# Patient Record
Sex: Male | Born: 1935 | Race: White | Hispanic: No | Marital: Married | Smoking: Never smoker
Health system: Southern US, Community
[De-identification: ages and names within clinical notes are randomized; demographics above are authoritative.]

## PROBLEM LIST (undated history)

## (undated) DIAGNOSIS — G9340 Encephalopathy, unspecified: Secondary | ICD-10-CM

## (undated) DIAGNOSIS — I255 Ischemic cardiomyopathy: Secondary | ICD-10-CM

## (undated) DIAGNOSIS — I2510 Atherosclerotic heart disease of native coronary artery without angina pectoris: Secondary | ICD-10-CM

## (undated) DIAGNOSIS — E785 Hyperlipidemia, unspecified: Secondary | ICD-10-CM

## (undated) DIAGNOSIS — R4182 Altered mental status, unspecified: Secondary | ICD-10-CM

## (undated) DIAGNOSIS — H9190 Unspecified hearing loss, unspecified ear: Secondary | ICD-10-CM

## (undated) DIAGNOSIS — N410 Acute prostatitis: Secondary | ICD-10-CM

## (undated) DIAGNOSIS — A523 Neurosyphilis, unspecified: Secondary | ICD-10-CM

## (undated) DIAGNOSIS — M1990 Unspecified osteoarthritis, unspecified site: Secondary | ICD-10-CM

## (undated) DIAGNOSIS — G40909 Epilepsy, unspecified, not intractable, without status epilepticus: Secondary | ICD-10-CM

## (undated) DIAGNOSIS — R413 Other amnesia: Secondary | ICD-10-CM

## (undated) DIAGNOSIS — R269 Unspecified abnormalities of gait and mobility: Secondary | ICD-10-CM

## (undated) HISTORY — DX: Unspecified osteoarthritis, unspecified site: M19.90

## (undated) HISTORY — DX: Epilepsy, unspecified, not intractable, without status epilepticus: G40.909

## (undated) HISTORY — DX: Hyperlipidemia, unspecified: E78.5

## (undated) HISTORY — DX: Ischemic cardiomyopathy: I25.5

## (undated) HISTORY — DX: Unspecified abnormalities of gait and mobility: R26.9

## (undated) HISTORY — DX: Encephalopathy, unspecified: G93.40

## (undated) HISTORY — DX: Acute prostatitis: N41.0

## (undated) HISTORY — DX: Other amnesia: R41.3

## (undated) HISTORY — DX: Atherosclerotic heart disease of native coronary artery without angina pectoris: I25.10

## (undated) HISTORY — DX: Unspecified hearing loss, unspecified ear: H91.90

---

## 2011-02-18 ENCOUNTER — Other Ambulatory Visit (HOSPITAL_COMMUNITY): Payer: Self-pay

## 2012-04-30 ENCOUNTER — Telehealth: Payer: Self-pay | Admitting: Pharmacist

## 2012-04-30 NOTE — Telephone Encounter (Signed)
Unable to leave message. Caller does not accept incoming calls.

## 2015-07-19 ENCOUNTER — Inpatient Hospital Stay (HOSPITAL_COMMUNITY)
Admission: EM | Admit: 2015-07-19 | Discharge: 2015-07-23 | DRG: 246 | Disposition: A | Discharge status: Home / Self Care | Payer: Medicare Other | Source: Other Acute Inpatient Hospital | Attending: Cardiovascular Disease | Admitting: Cardiovascular Disease

## 2015-07-19 ENCOUNTER — Encounter (HOSPITAL_COMMUNITY)
Admission: EM | Disposition: A | Discharge status: Home / Self Care | Payer: Self-pay | Source: Other Acute Inpatient Hospital | Attending: Cardiovascular Disease

## 2015-07-19 ENCOUNTER — Encounter (HOSPITAL_COMMUNITY): Payer: Self-pay | Admitting: Physician Assistant

## 2015-07-19 DIAGNOSIS — F99 Mental disorder, not otherwise specified: Secondary | ICD-10-CM | POA: Diagnosis not present

## 2015-07-19 DIAGNOSIS — I2584 Coronary atherosclerosis due to calcified coronary lesion: Secondary | ICD-10-CM | POA: Diagnosis present

## 2015-07-19 DIAGNOSIS — I5021 Acute systolic (congestive) heart failure: Secondary | ICD-10-CM | POA: Diagnosis present

## 2015-07-19 DIAGNOSIS — M79601 Pain in right arm: Secondary | ICD-10-CM | POA: Diagnosis present

## 2015-07-19 DIAGNOSIS — I2510 Atherosclerotic heart disease of native coronary artery without angina pectoris: Secondary | ICD-10-CM | POA: Diagnosis present

## 2015-07-19 DIAGNOSIS — I255 Ischemic cardiomyopathy: Secondary | ICD-10-CM | POA: Diagnosis present

## 2015-07-19 DIAGNOSIS — I213 ST elevation (STEMI) myocardial infarction of unspecified site: Secondary | ICD-10-CM | POA: Diagnosis present

## 2015-07-19 DIAGNOSIS — R079 Chest pain, unspecified: Secondary | ICD-10-CM | POA: Diagnosis present

## 2015-07-19 DIAGNOSIS — I2102 ST elevation (STEMI) myocardial infarction involving left anterior descending coronary artery: Secondary | ICD-10-CM | POA: Diagnosis present

## 2015-07-19 HISTORY — PX: CARDIAC CATHETERIZATION: SHX172

## 2015-07-19 LAB — TROPONIN I: TROPONIN I: 38.25 ng/mL — AB (ref <0.031)

## 2015-07-19 LAB — POCT ACTIVATED CLOTTING TIME: Activated Clotting Time: 503 seconds

## 2015-07-19 LAB — TSH: TSH: 1.032 u[IU]/mL (ref 0.350–4.500)

## 2015-07-19 LAB — MRSA PCR SCREENING: MRSA by PCR: NEGATIVE

## 2015-07-19 LAB — BRAIN NATRIURETIC PEPTIDE: B Natriuretic Peptide: 189.4 pg/mL — ABNORMAL HIGH (ref 0.0–100.0)

## 2015-07-19 SURGERY — LEFT HEART CATH AND CORONARY ANGIOGRAPHY

## 2015-07-19 MED ORDER — TICAGRELOR 90 MG PO TABS
ORAL_TABLET | ORAL | Status: DC | PRN
  Administered 2015-07-19: 180 mg via ORAL

## 2015-07-19 MED ORDER — SODIUM CHLORIDE 0.9 % IV SOLN
INTRAVENOUS | Status: AC
  Administered 2015-07-19: 16:00:00 via INTRAVENOUS

## 2015-07-19 MED ORDER — SODIUM CHLORIDE 0.9 % IJ SOLN
3.0000 mL | Freq: Two times a day (BID) | INTRAMUSCULAR | Status: DC
  Administered 2015-07-19 – 2015-07-23 (×7): 3 mL via INTRAVENOUS

## 2015-07-19 MED ORDER — ATORVASTATIN CALCIUM 80 MG PO TABS
80.0000 mg | ORAL_TABLET | Freq: Every day | ORAL | Status: DC
  Administered 2015-07-19 – 2015-07-22 (×4): 80 mg via ORAL
  Filled 2015-07-19 (×5): qty 1

## 2015-07-19 MED ORDER — CARVEDILOL 3.125 MG PO TABS
3.1250 mg | ORAL_TABLET | Freq: Two times a day (BID) | ORAL | Status: DC
  Administered 2015-07-19 – 2015-07-23 (×8): 3.125 mg via ORAL
  Filled 2015-07-19 (×10): qty 1

## 2015-07-19 MED ORDER — ONDANSETRON HCL 4 MG/2ML IJ SOLN: 4.0000 mg | Freq: Four times a day (QID) | INTRAMUSCULAR | Status: DC | PRN

## 2015-07-19 MED ORDER — ASPIRIN EC 81 MG PO TBEC
81.0000 mg | DELAYED_RELEASE_TABLET | Freq: Every day | ORAL | Status: DC
  Administered 2015-07-20 – 2015-07-23 (×4): 81 mg via ORAL
  Filled 2015-07-19 (×4): qty 1

## 2015-07-19 MED ORDER — TICAGRELOR 90 MG PO TABS
ORAL_TABLET | ORAL | Status: AC
  Filled 2015-07-19: qty 2

## 2015-07-19 MED ORDER — BIVALIRUDIN BOLUS VIA INFUSION - CUPID
INTRAVENOUS | Status: DC | PRN
  Administered 2015-07-19 (×2): 44.25 mg via INTRAVENOUS

## 2015-07-19 MED ORDER — BIVALIRUDIN 250 MG IV SOLR
INTRAVENOUS | Status: AC
  Filled 2015-07-19: qty 250

## 2015-07-19 MED ORDER — VERAPAMIL HCL 2.5 MG/ML IV SOLN
INTRAVENOUS | Status: DC | PRN
  Administered 2015-07-19: 14:00:00 via INTRA_ARTERIAL

## 2015-07-19 MED ORDER — ACETAMINOPHEN 325 MG PO TABS: 650.0000 mg | ORAL_TABLET | ORAL | Status: DC | PRN

## 2015-07-19 MED ORDER — MIDAZOLAM HCL 2 MG/2ML IJ SOLN
INTRAMUSCULAR | Status: AC
  Filled 2015-07-19: qty 4

## 2015-07-19 MED ORDER — FENTANYL CITRATE (PF) 100 MCG/2ML IJ SOLN
INTRAMUSCULAR | Status: DC | PRN
  Administered 2015-07-19: 25 ug via INTRAVENOUS

## 2015-07-19 MED ORDER — NITROGLYCERIN 1 MG/10 ML FOR IR/CATH LAB
INTRA_ARTERIAL | Status: AC
  Filled 2015-07-19: qty 10

## 2015-07-19 MED ORDER — HEPARIN (PORCINE) IN NACL 2-0.9 UNIT/ML-% IJ SOLN
INTRAMUSCULAR | Status: AC
  Filled 2015-07-19: qty 1500

## 2015-07-19 MED ORDER — HEPARIN SODIUM (PORCINE) 1000 UNIT/ML IJ SOLN
INTRAMUSCULAR | Status: AC
  Filled 2015-07-19: qty 1

## 2015-07-19 MED ORDER — FENTANYL CITRATE (PF) 100 MCG/2ML IJ SOLN
INTRAMUSCULAR | Status: AC
  Filled 2015-07-19: qty 4

## 2015-07-19 MED ORDER — SODIUM CHLORIDE 0.9 % IJ SOLN: 3.0000 mL | INTRAMUSCULAR | Status: DC | PRN

## 2015-07-19 MED ORDER — IOHEXOL 300 MG/ML  SOLN
INTRAMUSCULAR | Status: DC | PRN
  Administered 2015-07-19: 125 mL via INTRAVENOUS

## 2015-07-19 MED ORDER — NITROGLYCERIN 0.4 MG SL SUBL: 0.4000 mg | SUBLINGUAL_TABLET | SUBLINGUAL | Status: DC | PRN

## 2015-07-19 MED ORDER — SODIUM CHLORIDE 0.9 % IV SOLN: 250.0000 mL | INTRAVENOUS | Status: DC | PRN

## 2015-07-19 MED ORDER — TICAGRELOR 90 MG PO TABS
90.0000 mg | ORAL_TABLET | Freq: Two times a day (BID) | ORAL | Status: DC
  Administered 2015-07-19 – 2015-07-23 (×8): 90 mg via ORAL
  Filled 2015-07-19 (×10): qty 1

## 2015-07-19 MED ORDER — LIDOCAINE HCL (PF) 1 % IJ SOLN
INTRAMUSCULAR | Status: AC
  Filled 2015-07-19: qty 30

## 2015-07-19 MED ORDER — ASPIRIN 81 MG PO CHEW
324.0000 mg | CHEWABLE_TABLET | ORAL | Status: AC
  Administered 2015-07-19: 324 mg via ORAL
  Filled 2015-07-19: qty 4

## 2015-07-19 MED ORDER — ASPIRIN 300 MG RE SUPP: 300.0000 mg | RECTAL | Status: AC

## 2015-07-19 MED ORDER — MIDAZOLAM HCL 2 MG/2ML IJ SOLN
INTRAMUSCULAR | Status: DC | PRN
  Administered 2015-07-19: 1 mg via INTRAVENOUS

## 2015-07-19 SURGICAL SUPPLY — 19 items
BALLN EUPHORA RX 2.5X15 (BALLOONS) ×3
BALLN ~~LOC~~ EMERGE MR 3.5X20 (BALLOONS) ×3
BALLOON EUPHORA RX 2.5X15 (BALLOONS) ×1 IMPLANT
BALLOON ~~LOC~~ EMERGE MR 3.5X20 (BALLOONS) ×1 IMPLANT
CATH INFINITI 5 FR JL3.5 (CATHETERS) IMPLANT
CATH INFINITI 5FR ANG PIGTAIL (CATHETERS) ×3 IMPLANT
CATH INFINITI JR4 5F (CATHETERS) ×3 IMPLANT
CATH VISTA GUIDE 6FR XBLAD3.5 (CATHETERS) ×3 IMPLANT
DEVICE RAD COMP TR BAND LRG (VASCULAR PRODUCTS) ×3 IMPLANT
GLIDESHEATH SLEND SS 6F .021 (SHEATH) ×3 IMPLANT
KIT ENCORE 26 ADVANTAGE (KITS) ×3 IMPLANT
KIT HEART LEFT (KITS) ×3 IMPLANT
PACK CARDIAC CATHETERIZATION (CUSTOM PROCEDURE TRAY) ×3 IMPLANT
STENT PROMUS PREM MR 3.0X28 (Permanent Stent) ×3 IMPLANT
SYR MEDRAD MARK V 150ML (SYRINGE) ×3 IMPLANT
TRANSDUCER W/STOPCOCK (MISCELLANEOUS) ×3 IMPLANT
TUBING CIL FLEX 10 FLL-RA (TUBING) ×3 IMPLANT
WIRE COUGAR XT STRL 190CM (WIRE) ×3 IMPLANT
WIRE SAFE-T 1.5MM-J .035X260CM (WIRE) ×3 IMPLANT

## 2015-07-19 NOTE — Progress Notes (Signed)
CRITICAL VALUE ALERT  Critical value received:  Troponin 38.25  Date of notification:  07/19/15  Time of notification:  1740  Critical value read back:Yes.    Nurse who received alert:  Deretha Emory, RN  Pt is s/p STEMI, expected finding. Will continue to cycle troponin levels and monitor.

## 2015-07-19 NOTE — Progress Notes (Signed)
Ur review done 

## 2015-07-19 NOTE — H&P (Signed)
Patient ID: Justin Figueroa MRN: [id number], DOB/AGE: 07/08/1931   Admit date: 07/19/2015   Primary Physician: No primary care provider on file. Primary Cardiologist: New ( Dr. Excell Seltzer)    Pt. Profile:  Justin Figueroa is a 81 y.o. male with a history of right arm pain and no other past medical history who presented to [facility] today from [facility] as a CODE STEMI.   The patient denies a history of DM, HTN, HLD, stroke, bleeding or prior heart disease who presented to [facility] today with chest pain that started this morning after breakfast. ST elevations were noted on ECG and he was told he needed to be transferred to [facility] for cardiac cath. He left AMA but then returned with continued chest pain.   The chest pain started this morning and is described as indigestion like. It was not associated with SOB, diaphoresis, nausea or vomiting. No orthopnea, PND or LE edema. He has never smoked, does not drink or take drugs. He only takes one medication occasionally for arm pain, unclear what this is. He does not have a family history of cardiac disease that he knows of. Patient is difficult to understand and a poor historian.   Patient is now chest pain free after ASA and SL NTG. He is seen in the cardiac cath lab for cardiac catheterization. ECG with ST elevation in anterior leads.   Problem List  Past Medical History  Diagnosis Date  . Right arm pain     No past surgical history on file.   Allergies  Allergies not on file   Home Medications  Prior to Admission medications   Not on File    Family History  No family history on file. No family status information on file.     Social History  Social History   Social History  . Marital Status: Married    Spouse Name: N/A  . Number of Children: N/A  . Years of Education: N/A   Occupational History  . Not on file.   Social History Main Topics  . Smoking status: Not on file  . Smokeless tobacco: Not on file  .  Alcohol Use: Not on file  . Drug Use: Not on file  . Sexual Activity: Not on file   Other Topics Concern  . Not on file   Social History Narrative  . No narrative on file     All other systems reviewed and are otherwise negative except as noted above.  Physical Exam  SpO2 99 %.  General: Pleasant, NAD. Elderly and frail appearing. Poor dentition Psych: Normal affect. Neuro: Alert and oriented X 3. Moves all extremities spontaneously. HEENT: Normal  Neck: Supple without bruits or JVD. Lungs:  Resp regular and unlabored, CTA. Heart: RRR no s3, s4, or murmurs. Abdomen: Soft, non-tender, non-distended, BS + x 4.  Extremities: No clubbing, cyanosis or edema. DP/PT/Radials 2+ and equal bilaterally.  Labs  No results for input(s): CKTOTAL, CKMB, TROPONINI in the last 72 hours. No results found for: WBC, HGB, HCT, MCV, PLT No results for input(s): NA, K, CL, CO2, BUN, CREATININE, CALCIUM, PROT, BILITOT, ALKPHOS, ALT, AST, GLUCOSE in the last 168 hours.  Invalid input(s): LABALBU No results found for: CHOL, HDL, LDLCALC, TRIG No results found for: DDIMER   Radiology/Studies  No results found.  ECG NSR ST elevation in anterior leads.   ASSESSMENT AND PLAN  Justin Figueroa is a 81 y.o. male with a history of right arm  pain and no other past medical history who presented to [facility] today from [facility] as a CODE STEMI.   PLAN: He will undergo emergent cardiac catheterization and possible PCI.    Billy Fischer, PA-C 07/19/2015, 2:26 PM  Pager [phone]  Patient seen, examined. Available data reviewed. Agree with findings, assessment, and plan as outlined by Carlean Jews, PA-C. The patient is a difficult historian. He was evaluated this morning and diagnosed with an anterior STEMI. He left the hospital AGAINST MEDICAL ADVICE. He returned with continued chest pain approximately one hour later. A code STEMI was called and he is transferred emergently to the cardiac  catheterization lab for primary PCI. On arrival here his chest pain has improved. He states that it started at 8 AM today. He has no other complaints. He denies any significant medical problems. Exam as outlined above. EKG shows anterolateral injury current consistent with an acute anterior myocardial infarction. There are frequent PVCs. Emergency implied consent is obtained for cardiac catheterization and primary PCI. Further plans and disposition pending his cardiac catheterization results. Guideline directed medical therapy will be initiated.  Tonny Bollman, M.D. 07/19/2015 3:24 PM

## 2015-07-20 ENCOUNTER — Encounter (HOSPITAL_COMMUNITY): Payer: Self-pay | Admitting: Cardiovascular Disease

## 2015-07-20 ENCOUNTER — Inpatient Hospital Stay (HOSPITAL_COMMUNITY): Payer: Medicare Other

## 2015-07-20 DIAGNOSIS — R079 Chest pain, unspecified: Secondary | ICD-10-CM

## 2015-07-20 DIAGNOSIS — F99 Mental disorder, not otherwise specified: Secondary | ICD-10-CM

## 2015-07-20 LAB — CBC
HEMATOCRIT: 41.1 % (ref 39.0–52.0)
HEMOGLOBIN: 13.6 g/dL (ref 13.0–17.0)
MCH: 29.4 pg (ref 26.0–34.0)
MCHC: 33.1 g/dL (ref 30.0–36.0)
MCV: 88.8 fL (ref 78.0–100.0)
Platelets: 195 10*3/uL (ref 150–400)
RBC: 4.63 MIL/uL (ref 4.22–5.81)
RDW: 14.8 % (ref 11.5–15.5)
WBC: 10.1 10*3/uL (ref 4.0–10.5)

## 2015-07-20 LAB — BRAIN NATRIURETIC PEPTIDE: B Natriuretic Peptide: 682.1 pg/mL — ABNORMAL HIGH (ref 0.0–100.0)

## 2015-07-20 LAB — TROPONIN I: Troponin I: >65 ng/mL (ref <0.031)

## 2015-07-20 LAB — LIPID PANEL
CHOLESTEROL: 153 mg/dL (ref 0–200)
HDL: 25 mg/dL — AB (ref >40)
LDL Cholesterol: 104 mg/dL — ABNORMAL HIGH (ref 0–99)
TRIGLYCERIDES: 118 mg/dL (ref <150)
Total CHOL/HDL Ratio: 6.1 RATIO
VLDL: 24 mg/dL (ref 0–40)

## 2015-07-20 LAB — BASIC METABOLIC PANEL
ANION GAP: 6 (ref 5–15)
BUN: 9 mg/dL (ref 6–20)
CO2: 26 mmol/L (ref 22–32)
Calcium: 8.2 mg/dL — ABNORMAL LOW (ref 8.9–10.3)
Chloride: 108 mmol/L (ref 101–111)
Creatinine, Ser: 0.76 mg/dL (ref 0.61–1.24)
GFR calc Af Amer: >60 mL/min (ref >60)
GFR calc non Af Amer: >60 mL/min (ref >60)
GLUCOSE: 107 mg/dL — AB (ref 65–99)
POTASSIUM: 3.6 mmol/L (ref 3.5–5.1)
Sodium: 140 mmol/L (ref 135–145)

## 2015-07-20 LAB — HEMOGLOBIN A1C
Hgb A1c MFr Bld: 6.2 % — ABNORMAL HIGH (ref 4.8–5.6)
MEAN PLASMA GLUCOSE: 131 mg/dL

## 2015-07-20 MED ORDER — POTASSIUM CHLORIDE CRYS ER 20 MEQ PO TBCR
40.0000 meq | EXTENDED_RELEASE_TABLET | Freq: Once | ORAL | Status: AC
  Administered 2015-07-20: 40 meq via ORAL
  Filled 2015-07-20: qty 2

## 2015-07-20 MED FILL — Bivalirudin For IV Soln 250 MG: INTRAVENOUS | Qty: 250 | Status: AC

## 2015-07-20 MED FILL — Nitroglycerin IV Soln 100 MCG/ML in D5W: INTRA_ARTERIAL | Qty: 10 | Status: AC

## 2015-07-20 MED FILL — Sodium Chloride IV Soln 0.9%: INTRAVENOUS | Qty: 50 | Status: AC

## 2015-07-20 MED FILL — Heparin Sodium (Porcine) 2 Unit/ML in Sodium Chloride 0.9%: INTRAMUSCULAR | Qty: 1000 | Status: AC

## 2015-07-20 MED FILL — Lidocaine HCl Local Preservative Free (PF) Inj 1%: INTRAMUSCULAR | Qty: 60 | Status: AC

## 2015-07-20 MED FILL — Heparin Sodium (Porcine) 2 Unit/ML in Sodium Chloride 0.9%: INTRAMUSCULAR | Qty: 1500 | Status: AC

## 2015-07-20 NOTE — Progress Notes (Signed)
  Echocardiogram 2D Echocardiogram has been performed.  Cathie Beams 07/20/2015, 2:44 PM

## 2015-07-20 NOTE — Progress Notes (Addendum)
       Patient Name: Justin Figueroa Date of Encounter: 07/20/2015    SUBJECTIVE: The patient has significant social issues. He also has poor understanding of his illness. He feels great and wants to go home now. Hemolysis transfer him back to the hospital in the [place] so that a frank and put him up to carry him home. EF by acute angioma 35%.  TELEMETRY:  Intermittent PVCs. Normal sinus rhythm. Filed Vitals:   07/20/15 0500 07/20/15 0600 07/20/15 0725 07/20/15 0738  BP: 108/67 84/37 99/50    Pulse:    66  Temp:    97.6 F (36.4 C)  TempSrc:    Oral  Resp: Height:      Weight:      SpO2:   94%     Intake/Output Summary (Last 24 hours) at 07/20/15 0951 Last data filed at 07/20/15 0900  Gross per 24 hour  Intake 806.67 ml  Output   1209 ml  Net -402.33 ml   LABS: Basic Metabolic Panel:  Recent Labs  16/10/96 0309  NA 140  K 3.6  CL 108  CO2 26  GLUCOSE 107*  BUN 9  CREATININE 0.76  CALCIUM 8.2*   CBC:  Recent Labs  07/20/15 0309  WBC 10.1  HGB 13.6  HCT 41.1  MCV 88.8  PLT 195   Cardiac Enzymes:  Recent Labs  07/19/15 1630 07/19/15 2118 07/20/15 0309  TROPONINI 38.25* >65.00* >65.00*   BNP: Invalid input(s): POCBNP Hemoglobin A1C:  Recent Labs  07/19/15 1630  HGBA1C 6.2*   Fasting Lipid Panel:  Recent Labs  07/20/15 0309  CHOL 153  HDL 25*  LDLCALC 104*  TRIG 118  CHOLHDL 6.1    Radiology/Studies:  No new data  Physical Exam: Blood pressure 99/50, pulse 66, temperature 97.6 F (36.4 C), temperature source Oral, resp. rate 20, height 6' (1.829 m), weight 64 kg (141 lb 1.5 oz), SpO2 94 %. Weight change:   Wt Readings from Last 3 Encounters:  07/20/15 64 kg (141 lb 1.5 oz)   Flat neck veins. Lying flat in bed. S4 gallop Clear lungs  ASSESSMENT:  1. Large anterior myocardial infarction with delayed reperfusion. LAD treated with DES 2. Soft blood pressures preventing titration of medical therapy 3. Acute  systolic heart failure, without evidence of volume overload currently 4. Significant social issues including poor understanding and psychiatric illness.. At risk for medication compliance issues.  Plan:   Case management/social service help  Risk factor modification  Dual antiplatelets therapy for at least 1 year  Titrate heart failure therapy as tolerated by blood pressure.  If develops volume overload, implement spironolactone rather than a loop, until he is able to tolerate an Ace or ARB.  Under the current social/living/psychiatric constraints, should probably be in hospital until Monday.  Selinda Eon 07/20/2015, 9:51 AM

## 2015-07-20 NOTE — Progress Notes (Signed)
CARDIAC REHAB PHASE I   PRE:  Rate/Rhythm: 67 Bigeminy/trigeminy  BP:  Sitting: 92/41        SaO2: 100 RA  MODE:  Ambulation: 350 ft   POST:  Rate/Rhythm: 59 Bigeminy  BP:  Sitting: 83/44         SaO2: 97 RA  Pt ambulated 350 ft on RA, rolling walker, gait belt, handheld assist, steady gait, tolerated well. Pt denies cp, dizziness, DOE, declined rest stop. Pt BP low, denies symptoms. Pt did have 6 beats Vtach while sitting up in chair during education per RN, pt had no complaints. Began MI/stent education. Reviewed risk factors, anti-platelet therapy, stent card, activity restrictions. Gave pt MI book and reviewed pictures. Pt does not read and is difficult to assess compreshension. Pt verbalized understanding of education, did perform some teach back, however would benefit from further teach back/ review. Difficult to understand pt's speech at times and difficult to understand pt's psycho/social situation/needs. Pt states he lives alone and has a "case worker, Bethann Berkshire" who helps him with his medication and provides transportation for appointments. Pt will benefit from SW consult, possibly HHRN. Pt states he is agreeable to stay in hospital until the doctor releases him, will benefit from extra time to complete education. Pt declined MI video, RN states she will try to play it for him later. Pt to recliner after walk, call bell within reach.   [id number]   Joylene Grapes, RN, BSN 07/20/2015 12:02 PM

## 2015-07-20 NOTE — Progress Notes (Signed)
Pt had 6 beat run VT. VSS. Pt asymptomatic.  K 3.6 this AM.  Dr. Katrinka Blazing notified. Orders received.  Will continue to monitor.

## 2015-07-20 NOTE — Care Management Note (Signed)
Case Management Note  Patient Details  Name: Justin Figueroa MRN: [id number] Date of Birth: May 19, 1931  Subjective/Objective:         Adm w mi           Action/Plan:lives alone. He states medicaid case worker, his neighbors, mobile meals ck on him. He has 2 children also.    Expected Discharge Date:                  Expected Discharge Plan:  Home/Self Care  In-House Referral:     Discharge planning Services  CM Consult, Medication Assistance  Post Acute Care Choice:    Choice offered to:     DME Arranged:    DME Agency:     HH Arranged:    HH Agency:     Status of Service:     Medicare Important Message Given:    Date Medicare IM Given:    Medicare IM give by:    Date Additional Medicare IM Given:    Additional Medicare Important Message give by:     If discussed at Long Length of Stay Meetings, dates discussed:    Additional Comments: gave pt brilinta 30day free card. Pt states has medicare and medicaid for meds.  Hanley Hays, RN 07/20/2015, 9:12 AM

## 2015-07-20 NOTE — Progress Notes (Signed)
EKG CRITICAL VALUE     12 lead EKG performed.  Critical value noted.  Dayna Barker, RN notified.   Shakaria Raphael, CCT 07/20/2015 7:43 AM

## 2015-07-21 DIAGNOSIS — I255 Ischemic cardiomyopathy: Secondary | ICD-10-CM

## 2015-07-21 LAB — BASIC METABOLIC PANEL
Anion gap: 9 (ref 5–15)
BUN: 9 mg/dL (ref 6–20)
CALCIUM: 8.2 mg/dL — AB (ref 8.9–10.3)
CHLORIDE: 104 mmol/L (ref 101–111)
CO2: 25 mmol/L (ref 22–32)
CREATININE: 0.85 mg/dL (ref 0.61–1.24)
GFR calc non Af Amer: >60 mL/min (ref >60)
Glucose, Bld: 105 mg/dL — ABNORMAL HIGH (ref 65–99)
Potassium: 4.4 mmol/L (ref 3.5–5.1)
SODIUM: 138 mmol/L (ref 135–145)

## 2015-07-21 MED ORDER — SPIRONOLACTONE 25 MG PO TABS
12.5000 mg | ORAL_TABLET | Freq: Every day | ORAL | Status: DC
  Administered 2015-07-21 – 2015-07-23 (×3): 12.5 mg via ORAL
  Filled 2015-07-21 (×3): qty 1

## 2015-07-21 NOTE — Progress Notes (Signed)
CARDIAC REHAB PHASE I   PRE:  Rate/Rhythm: 80 NSR with frequent pvcs  BP:  Sitting: 94/68      SaO2: 98 RA  MODE:  Ambulation: 350 ft   POST:  Rate/Rhythm: 88 SR with pvcs  BP:  Sitting: 113/68     SaO2: 99 RA 1325-1345 Patient ambulated in hallway x 1 assist with RW. Slow steady gait noted. Patient denied complaints. Post ambulation patient to chair with call bell, phone and bedside table in reach. Possible D/C Monday. Will continue to follow and reinforce education prior to D/C.  Maude Leriche, BSN 07/21/2015 1:48 PM

## 2015-07-21 NOTE — Progress Notes (Signed)
EKG CRITICAL VALUE     12 lead EKG performed.  Critical value noted.  Ann Maki, RN notified.   Ihor Gully, CCT 07/21/2015 6:59 AM

## 2015-07-21 NOTE — Progress Notes (Signed)
Patient ID: Justin Figueroa, male   DOB: 06/02/1931, 81 y.o.   MRN: [id number]   SUBJECTIVE: Pressured speech.  No chest pain or dyspnea.  BP remains soft.  No lightheadedness.   Scheduled Meds: . aspirin EC  81 mg Oral Daily  . atorvastatin  80 mg Oral q1800  . carvedilol  3.125 mg Oral BID WC  . sodium chloride  3 mL Intravenous Q12H  . spironolactone  12.5 mg Oral Daily  . ticagrelor  90 mg Oral BID   Continuous Infusions:  PRN Meds:.sodium chloride, acetaminophen, nitroGLYCERIN, ondansetron (ZOFRAN) IV, sodium chloride    Filed Vitals:   07/21/15 0500 07/21/15 0600 07/21/15 0700 07/21/15 0730  BP: 101/35 71/46 103/54 103/54  Pulse:      Temp:    99.2 F (37.3 C)  TempSrc:    Oral  Resp: Height:      Weight:      SpO2:    97%    Intake/Output Summary (Last 24 hours) at 07/21/15 0850 Last data filed at 07/21/15 0800  Gross per 24 hour  Intake    690 ml  Output    700 ml  Net    -10 ml    LABS: Basic Metabolic Panel:  Recent Labs  40/98/11 0309  NA 140  K 3.6  CL 108  CO2 26  GLUCOSE 107*  BUN 9  CREATININE 0.76  CALCIUM 8.2*   Liver Function Tests: No results for input(s): AST, ALT, ALKPHOS, BILITOT, PROT, ALBUMIN in the last 72 hours. No results for input(s): LIPASE, AMYLASE in the last 72 hours. CBC:  Recent Labs  07/20/15 0309  WBC 10.1  HGB 13.6  HCT 41.1  MCV 88.8  PLT 195   Cardiac Enzymes:  Recent Labs  07/19/15 1630 07/19/15 2118 07/20/15 0309  TROPONINI 38.25* >65.00* >65.00*   BNP: Invalid input(s): POCBNP D-Dimer: No results for input(s): DDIMER in the last 72 hours. Hemoglobin A1C:  Recent Labs  07/19/15 1630  HGBA1C 6.2*   Fasting Lipid Panel:  Recent Labs  07/20/15 0309  CHOL 153  HDL 25*  LDLCALC 104*  TRIG 118  CHOLHDL 6.1   Thyroid Function Tests:  Recent Labs  07/19/15 1630  TSH 1.032   Anemia Panel: No results for input(s): VITAMINB12, FOLATE, FERRITIN, TIBC, IRON, RETICCTPCT in  the last 72 hours.  RADIOLOGY: No results found.  PHYSICAL EXAM General: NAD, thin Neck: No JVD, no thyromegaly or thyroid nodule.  Lungs: Clear to auscultation bilaterally with normal respiratory effort. CV: Nondisplaced PMI.  Heart regular S1/S2, no S3/S4, no murmur.  No peripheral edema.  No carotid bruit.  Normal pedal pulses.  Abdomen: Soft, nontender, no hepatosplenomegaly, no distention.  Neurologic: Alert and oriented x 3.  Psych: Normal affect, pressured speech Extremities: No clubbing or cyanosis.   TELEMETRY: Reviewed telemetry pt in NSR with PVCs  ASSESSMENT AND PLAN: 81 yo with anterior STEMI with delayed presentation, now with ischemic cardiomyopathy.  1. CAD: Anterior STEMI with delayed presentation.  S/p LAD PCI with DES.   - Continue ASA 81, Brilinta, atorvastatin.  - Ambulate, cardiac rehab.  2. Ischemic cardiomyopathy: EF 25%.  He is not volume overloaded on exam.  No dyspnea.  - Continue low dose Coreg, will add spironolactone 12.5 mg daily.  - BP too low to add ACEI.  - No Lifevest (age, poor understanding/suspected psych issues).  3. OK for step down.  4. Will need social work assistance I  suspect at discharge (probably Monday given large MI with fall in EF).   Marca Ancona 07/21/2015 8:53 AM

## 2015-07-21 NOTE — Progress Notes (Signed)
Order to assist pt with transportation needs received.  ? D/c on Monday, per MD.  Unit CSW will be notified to f/u with pt on Monday re: transportation home.

## 2015-07-22 LAB — CBC
HCT: 40.8 % (ref 39.0–52.0)
Hemoglobin: 13.2 g/dL (ref 13.0–17.0)
MCH: 28.9 pg (ref 26.0–34.0)
MCHC: 32.4 g/dL (ref 30.0–36.0)
MCV: 89.3 fL (ref 78.0–100.0)
PLATELETS: 191 10*3/uL (ref 150–400)
RBC: 4.57 MIL/uL (ref 4.22–5.81)
RDW: 14.9 % (ref 11.5–15.5)
WBC: 8.8 10*3/uL (ref 4.0–10.5)

## 2015-07-22 LAB — BASIC METABOLIC PANEL
Anion gap: 6 (ref 5–15)
BUN: 11 mg/dL (ref 6–20)
CALCIUM: 8.4 mg/dL — AB (ref 8.9–10.3)
CHLORIDE: 105 mmol/L (ref 101–111)
CO2: 28 mmol/L (ref 22–32)
CREATININE: 0.93 mg/dL (ref 0.61–1.24)
GFR calc non Af Amer: >60 mL/min (ref >60)
Glucose, Bld: 104 mg/dL — ABNORMAL HIGH (ref 65–99)
Potassium: 3.5 mmol/L (ref 3.5–5.1)
SODIUM: 139 mmol/L (ref 135–145)

## 2015-07-22 MED ORDER — LISINOPRIL 2.5 MG PO TABS
2.5000 mg | ORAL_TABLET | Freq: Every evening | ORAL | Status: DC
Start: 2015-07-22 — End: 2015-07-23
  Administered 2015-07-22: 2.5 mg via ORAL
  Filled 2015-07-22: qty 1

## 2015-07-22 NOTE — Progress Notes (Signed)
Patient ID: Justin Figueroa, male   DOB: 08-15-1931, 81 y.o.   MRN: [id number]   SUBJECTIVE: Pressured speech.  No chest pain or dyspnea.  BP remains soft but stable.  No lightheadedness.   Scheduled Meds: . aspirin EC  81 mg Oral Daily  . atorvastatin  80 mg Oral q1800  . carvedilol  3.125 mg Oral BID WC  . lisinopril  2.5 mg Oral QPM  . sodium chloride  3 mL Intravenous Q12H  . spironolactone  12.5 mg Oral Daily  . ticagrelor  90 mg Oral BID   Continuous Infusions:  PRN Meds:.sodium chloride, acetaminophen, nitroGLYCERIN, ondansetron (ZOFRAN) IV, sodium chloride    Filed Vitals:   07/22/15 0458 07/22/15 0500 07/22/15 0600 07/22/15 0700  BP:  101/57 94/54 93/53   Pulse:      Temp:      TempSrc:      Resp: 16 18 14 18   Height:      Weight: 143 lb 8.3 oz (65.1 kg)     SpO2:        Intake/Output Summary (Last 24 hours) at 07/22/15 0809 Last data filed at 07/22/15 0600  Gross per 24 hour  Intake    480 ml  Output   1100 ml  Net   -620 ml    LABS: Basic Metabolic Panel:  Recent Labs  78/29/56 1228 07/22/15 0232  NA 138 139  K 4.4 3.5  CL 104 105  CO2 25 28  GLUCOSE 105* 104*  BUN 9 11  CREATININE 0.85 0.93  CALCIUM 8.2* 8.4*   Liver Function Tests: No results for input(s): AST, ALT, ALKPHOS, BILITOT, PROT, ALBUMIN in the last 72 hours. No results for input(s): LIPASE, AMYLASE in the last 72 hours. CBC:  Recent Labs  07/20/15 0309 07/22/15 0232  WBC 10.1 8.8  HGB 13.6 13.2  HCT 41.1 40.8  MCV 88.8 89.3  PLT 195 191   Cardiac Enzymes:  Recent Labs  07/19/15 1630 07/19/15 2118 07/20/15 0309  TROPONINI 38.25* >65.00* >65.00*   BNP: Invalid input(s): POCBNP D-Dimer: No results for input(s): DDIMER in the last 72 hours. Hemoglobin A1C:  Recent Labs  07/19/15 1630  HGBA1C 6.2*   Fasting Lipid Panel:  Recent Labs  07/20/15 0309  CHOL 153  HDL 25*  LDLCALC 104*  TRIG 118  CHOLHDL 6.1   Thyroid Function Tests:  Recent Labs  07/19/15 1630  TSH 1.032   Anemia Panel: No results for input(s): VITAMINB12, FOLATE, FERRITIN, TIBC, IRON, RETICCTPCT in the last 72 hours.  RADIOLOGY: No results found.  PHYSICAL EXAM General: NAD, thin Neck: No JVD, no thyromegaly or thyroid nodule.  Lungs: Clear to auscultation bilaterally with normal respiratory effort. CV: Nondisplaced PMI.  Heart regular S1/S2, no S3/S4, no murmur.  No peripheral edema.  No carotid bruit.  Normal pedal pulses.  Abdomen: Soft, nontender, no hepatosplenomegaly, no distention.  Neurologic: Alert and oriented x 3.  Psych: Normal affect, pressured speech Extremities: No clubbing or cyanosis.   TELEMETRY: Reviewed telemetry pt in NSR with PVCs  ASSESSMENT AND PLAN: 81 yo with anterior STEMI with delayed presentation, now with ischemic cardiomyopathy.  1. CAD: Anterior STEMI with delayed presentation.  S/p LAD PCI with DES.   - Continue ASA 81, Brilinta, atorvastatin.  - Ambulate, cardiac rehab.  2. Ischemic cardiomyopathy: EF 25%.  He is not volume overloaded on exam.  No dyspnea.  - Continue low dose Coreg and spironolactone.   - Will add low dose lisinopril to  be taken in evening.   - No Lifevest (age, poor understanding/suspected psych issues).  3. OK for step down.  4. Will need social work assistance I suspect at discharge (probably Monday given large MI with fall in EF).   Marca Ancona 07/22/2015 8:09 AM

## 2015-07-22 NOTE — Progress Notes (Signed)
Report given to Whitney, RN

## 2015-07-23 ENCOUNTER — Telehealth: Payer: Self-pay | Admitting: Cardiovascular Disease

## 2015-07-23 MED ORDER — TICAGRELOR 90 MG PO TABS: 90.0000 mg | ORAL_TABLET | Freq: Two times a day (BID) | ORAL | Status: DC

## 2015-07-23 MED ORDER — ASPIRIN 81 MG PO TBEC: 81.0000 mg | DELAYED_RELEASE_TABLET | Freq: Every day | ORAL | Status: AC

## 2015-07-23 MED ORDER — SPIRONOLACTONE 25 MG PO TABS: 12.5000 mg | ORAL_TABLET | Freq: Every day | ORAL | Status: DC

## 2015-07-23 MED ORDER — ATORVASTATIN CALCIUM 80 MG PO TABS: 80.0000 mg | ORAL_TABLET | Freq: Every day | ORAL | Status: DC

## 2015-07-23 MED ORDER — NITROGLYCERIN 0.4 MG SL SUBL: 0.4000 mg | SUBLINGUAL_TABLET | SUBLINGUAL | Status: AC | PRN

## 2015-07-23 MED ORDER — CARVEDILOL 3.125 MG PO TABS: 3.1250 mg | ORAL_TABLET | Freq: Two times a day (BID) | ORAL | Status: DC

## 2015-07-23 MED ORDER — LISINOPRIL 2.5 MG PO TABS: 2.5000 mg | ORAL_TABLET | Freq: Every evening | ORAL | Status: DC

## 2015-07-23 NOTE — Telephone Encounter (Signed)
Social Services resch appt  9/2 @ 9:30 w/ Ward Givens

## 2015-07-23 NOTE — Discharge Instructions (Signed)
Radial Site Care Refer to this sheet in the next few weeks. These instructions provide you with information on caring for yourself after your procedure. Your caregiver may also give you more specific instructions. Your treatment has been planned according to current medical practices, but problems sometimes occur. Call your caregiver if you have any problems or questions after your procedure. HOME CARE INSTRUCTIONS  You may shower the day after the procedure.Remove the bandage (dressing) and gently wash the site with plain soap and water.Gently pat the site dry.  Do not apply powder or lotion to the site.  Do not submerge the affected site in water for 3 to 5 days.  Inspect the site at least twice daily.  Do not flex or bend the affected arm for 24 hours.  No lifting over 5 pounds (2.3 kg) for 5 days after your procedure.  Do not drive home if you are discharged the same day of the procedure. Have someone else drive you.  You may drive 24 hours after the procedure unless otherwise instructed by your caregiver.  Do not operate machinery or power tools for 24 hours.  A responsible adult should be with you for the first 24 hours after you arrive home. What to expect:  Any bruising will usually fade within 1 to 2 weeks.  Blood that collects in the tissue (hematoma) may be painful to the touch. It should usually decrease in size and tenderness within 1 to 2 weeks. SEEK IMMEDIATE MEDICAL CARE IF:  You have unusual pain at the radial site.  You have redness, warmth, swelling, or pain at the radial site.  You have drainage (other than a small amount of blood on the dressing).  You have chills.  You have a fever or persistent symptoms for more than 72 hours.  You have a fever and your symptoms suddenly get worse.  Your arm becomes pale, cool, tingly, or numb.  You have heavy bleeding from the site. Hold pressure on the site. Document Released: 12/20/2010 Document Revised:  02/09/2012 Document Reviewed: 12/20/2010 [facility] Patient Information 2015 [place], [place]. This information is not intended to replace advice given to you by your health care provider. Make sure you discuss any questions you have with your health care provider. Myocardial Infarction A myocardial infarction (MI) is damage to the heart that is not reversible. It is also called a heart attack. An MI usually occurs when a heart (coronary) artery becomes blocked or narrowed. This cuts off the blood supply to the heart. When one or more of the heart (coronary) arteries becomes blocked, that area of the heart begins to die. This causes pain felt during an MI.  If you think you might be having an MI, call your local emergency services immediately (911 in [place] It is recommended that you chew and swallow 3 non-enteric coated baby aspirin if you do not have an aspirin allergy. Do not drive yourself to the hospital or wait to see if your symptoms go away. The sooner MI is treated, the greater the amount of heart muscle saved. Time is muscle. It can save your life. CAUSES  An MI can occur from:  A gradual buildup of a fatty substance called plaque. When plaque builds up in the arteries, this condition is called atherosclerosis. This buildup can block or reduce the blood supply to the heart artery(s).  A sudden plaque rupture within a heart artery that causes a blood clot (thrombus). A blood clot can block the heart artery which  does not allow blood flow to the heart.  A severe tightening (spasm) of the heart artery. This is a less common cause of a heart attack. When a heart artery spasms, it cuts off blood flow through the artery. Spasms can occur in heart arteries that do not have atherosclerosis. RISK FACTORS People at risk for an MI usually have one or more risk factors, such as:  High blood pressure.  High cholesterol.  Smoking.  Gender. Men have a higher heart attack  risk.  Overweight/obesity.  Age.  Family history.  Lack of exercise.  Diabetes.  Stress.  Excessive alcohol use.  Street drug use (cocaine and methamphetamines). SYMPTOMS  MI symptoms can vary, such as:  In both men and women, MI symptoms can include the following:  Chest pain. The chest pain may feel like a crushing, squeezing, or "pressure" type feeling. MI pain can be "referred," meaning pain can be caused in one part of the body but felt in another part of the body. Referred MI pain may occur in the left arm, neck, or jaw. Pain may even be felt in the right arm.  Shortness of breath (dyspnea).  Heartburn or indigestion with or without vomiting, shortness of breath, or sweating (diaphoresis).  Sudden, cold sweats.  Sudden lightheadedness.  Upper back pain.  Women can have unique MI symptoms, such as:  Unexplained feelings of nervousness or anxiety.  Discomfort between the shoulder blades (scapula) or upper back.  Tingling in the hands and arms.  In elderly people (regardless of gender), MI symptoms can be subtle, such as:  Sweating (diaphoresis).  Shortness of breath (dyspnea).  General tiredness (fatigue) or not feeling well (malaise). DIAGNOSIS  Diagnosis of an MI involves several tests such as:  An assessment of your vital signs such as heart rhythm, blood pressure, respiratory rate, and oxygen level.  An EKG (ECG) to look at the electrical activity of your heart.  Blood tests called cardiac markers are drawn at scheduled times to measure proteins or enzymes released by the damaged heart muscle.  A chest X-ray.  An echocardiogram to evaluate heart motion and blood flow.  Coronary angiography (cardiac catheterization). This is a diagnostic procedure to look at the heart arteries. TREATMENT  Acute Intervention. For an MI, the national standard in the [place] is to have an acute intervention in under 90 minutes from the time you get to the  hospital. An acute intervention is a special procedure to open up the heart arteries. It is done in a treatment room called a "catheterization lab" (cath lab). Some hospitals do no have a cath lab. If you are having an MI and the hospital does not have a cath lab, the standard is to transport you to a hospital that has one. In the cath lab, acute intervention includes:  Angioplasty. An angioplasty involves inserting a thin, flexible tube (catheter) into an artery in either your groin or wrist. The catheter is threaded to the heart arteries. A balloon at the end of the catheter is inflated to open a narrowed or blocked heart artery. During an angioplasty procedure, a small mesh tube (stent) may be used to keep the heart artery open. Depending on your condition and health history, one of two types of stents may be placed:  Drug-eluting stent (DES). A DES is coated with a medicine to prevent scar tissue from growing over the stent. With drug-eluting stents, blood thinning medicine will need to be taken for up to a year.  Bare metal stent. This type of stent has no special coating to keep tissue from growing over it. This type of stent is used if you cannot take blood thinning medicine for a prolonged time or you need surgery in the near future. After a bare metal stent is placed, blood thinning medicine will need to be taken for about a month.  If you are taking blood thinning medicine (anti-platelet therapy) after stent placement, do not stop taking it unless your caregiver says it is okay to do so. Make sure you understand how long you need to take the medicine. Surgical Intervention  If an acute intervention is not successful, surgery may be needed:  Open heart surgery (coronary artery bypass graft, CABG). CABG takes a vein (saphenous vein) from your leg. The vein is then attached to the blocked heart artery which bypasses the blockage. This then allows blood flow to the heart muscle. Additional  Interventions  A "clot buster" medicine (thrombolytic) may be given. This medicine can help break up a clot in the heart artery. This medicine may be given if a person cannot get to a cath lab right away.  Intra-aortic balloon pump (IABP). If you have suffered a very severe MI and are too unstable to go to the cath lab or to surgery, an IABP may be used. This is a temporary mechanical device used to increase blood flow to the heart and reduce the workload of the heart until you are stable enough to go to the cath lab or surgery. HOME CARE INSTRUCTIONS After an MI, you may need the following:  Medicine. Take medicine as directed by your caregiver. Medicines after an MI may:  Keep your blood from clotting easily (blood thinners).  Control your blood pressure.  Help lower your cholesterol.  Control abnormal heart rhythms.  Lifestyle changes. Under the guidance of your caregiver, lifestyle changes include:  Quitting smoking, if you smoke. Your caregiver can help you quit.  Being physically active.  Maintaining a healthy weight.  Eating a heart healthy diet. A dietitian can help you learn healthy eating options.  Managing diabetes.  Reducing stress.  Limiting alcohol intake. SEEK IMMEDIATE MEDICAL CARE IF:   You have severe chest pain, especially if the pain is crushing or pressure-like and spreads to the arms, back, neck, or jaw. This is an emergency. Do not wait to see if the pain will go away. Get medical help at once. Call your local emergency services (911 in the U[place] Do not drive yourself to the hospital.  You have shortness of breath during rest, sleep, or with activity.  You have sudden sweating or clammy skin.  You feel sick to your stomach (nauseous) and throw up (vomit).  You suddenly become lightheaded or dizzy.  You feel your heart beating rapidly or you notice "skipped" beats. MAKE SURE YOU:   Understand these instructions.  Will watch your  condition.  Will get help right away if you are not doing well or get worse. Document Released: 11/17/2005 Document Revised: 11/22/2013 Document Reviewed: 01/20/2014 [facility] Patient Information 2015 [place], [place]. This information is not intended to replace advice given to you by your health care provider. Make sure you discuss any questions you have with your health care provider.

## 2015-07-23 NOTE — Progress Notes (Signed)
CARDIAC REHAB PHASE I   PRE:  Rate/Rhythm: 69 SR PVCs  BP:  Supine: 109/51  Sitting:   Standing:    SaO2: 93%RA  MODE:  Ambulation: 550 ft   POST:  Rate/Rhythm: 70 SR  BP:  Supine: 119/84  Sitting:   Standing:    SaO2: 95%RA 1011-1110 Pt walked 550 ft with his cane and minimal asst. Encouraged him to walk at home with assistance and he stated he could get his neighbor to walk with him. Denied CP. Pt stated he gets Meals on Wheels and he might fix a sandwich or cereal sometime. Encouraged him not to use salt shaker and try to not eat canned things. Pt also stated he is going to get Med Alert next week. Discussed with pt that he should notify Med Alert or call 911 for CP and to let MD office know if he feels SOB or feels bloated as he does not have scales at home to weigh. Pt does not read but has a case manager that he stated will help him with ed needs. Pt has brilinta card and we discussed that he is to take twice a day. Knows to not lift anything heavy and to take it easy. Discussed pt with case manager here. Pt has not transportation for  CRP 2 as he does not drive and not interested in attending.   Luetta Nutting, RN BSN  07/23/2015 11:05 AM

## 2015-07-23 NOTE — Progress Notes (Signed)
Patient Profile: 81 yo with anterior STEMI with delayed presentation, now with ischemic cardiomyopathy  Subjective: No complaints. Denies any recurrent chest pain. No dyspnea.   Objective: Vital signs in last 24 hours: Temp:  [98.3 F (36.8 C)-99 F (37.2 C)] 98.5 F (36.9 C) (08/22 0815) Pulse Rate:  [55-74] 71 (08/22 0815) Resp:  [15-22] 16 (08/22 0815) BP: (66-109)/(34-67) 91/55 mmHg (08/22 0815) SpO2:  [94 %-95 %] 94 % (08/22 0815) Weight:  [143 lb 8.3 oz (65.1 kg)-144 lb (65.318 kg)] 144 lb (65.318 kg) (08/22 0500) Last BM Date: 07/21/15  Intake/Output from previous day: 08/21 0701 - 08/22 0700 In: 600 [P.O.:600] Out: 350 [Urine:350] Intake/Output this shift:    Medications Current Facility-Administered Medications  Medication Dose Route Frequency Provider Last Rate Last Dose  . 0.9 %  sodium chloride infusion  250 mL Intravenous PRN Tonny Bollman, MD      . acetaminophen (TYLENOL) tablet 650 mg  650 mg Oral Q4H PRN Tonny Bollman, MD      . aspirin EC tablet 81 mg  81 mg Oral Daily Tonny Bollman, MD   81 mg at 07/22/15 0900  . atorvastatin (LIPITOR) tablet 80 mg  80 mg Oral q1800 Tonny Bollman, MD   80 mg at 07/22/15 1750  . carvedilol (COREG) tablet 3.125 mg  3.125 mg Oral BID WC Tonny Bollman, MD   3.125 mg at 07/22/15 1750  . lisinopril (PRINIVIL,ZESTRIL) tablet 2.5 mg  2.5 mg Oral QPM Laurey Morale, MD   2.5 mg at 07/22/15 1751  . nitroGLYCERIN (NITROSTAT) SL tablet 0.4 mg  0.4 mg Sublingual Q5 Min x 3 PRN Tonny Bollman, MD      . ondansetron [facility]) injection 4 mg  4 mg Intravenous Q6H PRN Tonny Bollman, MD      . sodium chloride 0.9 % injection 3 mL  3 mL Intravenous Q12H Tonny Bollman, MD   3 mL at 07/22/15 2046  . sodium chloride 0.9 % injection 3 mL  3 mL Intravenous PRN Tonny Bollman, MD      . spironolactone (ALDACTONE) tablet 12.5 mg  12.5 mg Oral Daily Laurey Morale, MD   12.5 mg at 07/22/15 0900  . ticagrelor (BRILINTA) tablet 90 mg  90  mg Oral BID Tonny Bollman, MD   90 mg at 07/22/15 2046    PE: BP 107/62 mmHg  Pulse 76  Temp(Src) 98.5 F (36.9 C) (Oral)  Resp 16  Ht  (1.727 m)  Wt 65.318 kg (144 lb)  BMI 21.90 kg/m2  SpO2 94%  General appearance: alert, cooperative and no distress Neck: no carotid bruit and no JVD Lungs: decreased breath sounds bilaterally Heart: regular rate and rhythm Extremities: no LEE Pulses: 2+ and symmetric Skin: warm and dry Neurologic: Grossly normal  Lab Results:   Recent Labs  07/22/15 0232  WBC 8.8  HGB 13.2  HCT 40.8  PLT 191   BMET  Recent Labs  07/21/15 1228 07/22/15 0232  NA 138 139  K 4.4 3.5  CL 104 105  CO2 25 28  GLUCOSE 105* 104*  BUN 9 11  CREATININE 0.85 0.93  CALCIUM 8.2* 8.4*    Assessment/Plan  Active Problems:   Right arm pain   STEMI (ST elevation myocardial infarction)   ST elevation (STEMI) myocardial infarction involving left anterior descending coronary artery   Psychiatric illness   1. CAD: Anterior STEMI with delayed presentation. S/p LAD PCI with DES. no recurrent CP. Ambulating w/o difficulty.  -  Continue ASA 81, Brilinta, atorvastatin.   2. Ischemic cardiomyopathy: EF 25%. He is not volume overloaded on exam. No dyspnea.  - Continue low dose Coreg, lisinopril and spironolactone. Due to soft BP, will continue lisinopril at bedtime. - No Lifevest (age, poor understanding/suspected psych issues).   Dispo: possible d/c later today after MD sees.    LOS: 4 days    Brittainy M. Delmer Islam 07/23/2015 9:40 AM  Attending Note:   The patient was seen and examined.  Agree with assessment and plan as noted above.  Changes made to the above note as needed.  Pt is doing well. Home today with transition of care visit within 7-10 days with Dr. Excell Seltzer / PA  Will need written instructions so that his health care helper can assist with meds.  Continue current meds    Vesta Mixer, Montez Hageman., MD, [facility] 07/23/2015,  11:24 AM [address]  [address] Office [phone] Pager [phone]

## 2015-07-23 NOTE — Care Management Note (Addendum)
Case Management Note  Patient Details  Name: Justin Figueroa MRN: [id number] Date of Birth: 09-12-1931  Subjective/Objective: Pt admitted for [facility]. Post cath will be d/c on brilinta. Pt uses [facility] in [place].                    Action/Plan: CM did call [facility] and medication is available for $3.60. Pt has Medicaid on file as well for medication assistance. Pt's transportation to be provided by [facility] Case Worker. He will pick up medications for pt.  Per pt he receives Meals on Wheels. Staff RN to provide education to [facility] Case worker. No further needs from CM at this time.    Expected Discharge Date:                  Expected Discharge Plan:  Home/Self Care  In-House Referral:  Clinical Social Work  Discharge planning Services  CM Consult, Medication Assistance  Post Acute Care Choice:  NA Choice offered to:  NA  DME Arranged:  N/A DME Agency:  NA  HH Arranged:  NA HH Agency:  NA  Status of Service:  Completed, signed off  Medicare Important Message Given:  Yes-second notification given Date Medicare IM Given:    Medicare IM give by:    Date Additional Medicare IM Given:    Additional Medicare Important Message give by:     If discussed at Long Length of Stay Meetings, dates discussed:    Additional Comments:  Gala Lewandowsky, RN 07/23/2015, 11:48 AM

## 2015-07-23 NOTE — Telephone Encounter (Signed)
New problem    Pt has TOC w/Luke Kilroy on 8.29.16 per [place] calling.

## 2015-07-23 NOTE — Discharge Summary (Signed)
Physician Discharge Summary  Patient ID: Justin Figueroa MRN: [id number] DOB/AGE: 79-May-1932 79 y.o.   Primary Cardiologist: Dr. Excell Seltzer  Admit date: 07/19/2015 Discharge date: 07/23/2015  Admission Diagnoses: STEMI  Discharge Diagnoses:  Active Problems:   Right arm pain   STEMI (ST elevation myocardial infarction)   ST elevation (STEMI) myocardial infarction involving left anterior descending coronary artery   Psychiatric illness   Discharged Condition: stable  Hospital Course: 81 y/o male with no prior cardiac history admitted 07/19/15 for STEMI. He initially presented to an outside hospital with chest pain and EKG revealed ST elevations. However, the patient left AMA. He later returned 1 hr later with continued chest pain. A code STEMI was called and he was transferred to [facility] for urgent LHC. The procedure was performed by Dr. Excell Seltzer. He was found to have total occlusion of the proximal LAD successfully treated with primary PCI utilizing a DES. There was diffuse nonobstructive CAD involving the LCx and RCA, for which medical therapy was elected. He was noted to have severe LV systolic dysfunction consistent with anterior infarct. EF was estimated at 35%. He tolerate the procedure well and left the cath lab instable condition. His chest pain resolved post intervention. Troponins were cycled and showed downward trend (>65 -->38.35). He was placed on DAPT with ASA + Brilinta, along with Coreg, lisinopril and spironolactone for his cardiomyopathy. Post procedural 2D echo confirmed low systolic function. EF by echo was 25%. Given his age, poor understanding/suspected psych issues, decision was made to not order a LifeVest. His volume status remained stable. He was noted to have soft SBP with his HF meds. Subsequently, his lisinopril was scheduled to be taken at bedtime. He had no other issues. He was last seen and examined by Dr. Elease Hashimoto who determined he was stable for discharge home. TOC post  hospital follow-up has been arranged with Corine Shelter, PA-C, on 07/30/15. He will be followed long term by Dr. Excell Seltzer. He will need a repeat 2D echo in 3 months.   Consults: None  Significant Diagnostic Studies:    LHC 07/19/15 Conclusion    1. Prox RCA lesion, 50% stenosed. 2. Mid RCA lesion, 50% stenosed. 3. Mid Cx lesion, 40% stenosed. 4. Prox LAD-2 lesion, 100% stenosed. 5. Prox LAD-1 lesion, 70% stenosed. There is a 0% residual stenosis post intervention. 6. There is severe left ventricular systolic dysfunction.  FINAL CONCLUSIONS:  TOTAL OCCLUSION OF THE PROXIMAL LAD (ANTERIOR STEMI) TREATED SUCCESSFULLY WITH PRIMARY PCI USING A DRUG-ELUTING STENT  DIFFUSE NONOBSTRUCTIVE CAD INVOLVING THE LCX AND RCA  SEVERE SEGMENTAL LV SYSTOLIC DYSFUNCTION CONSISTENT WITH ANTERIOR INFARCT  RECOMMENDATIONS:  DAPT WITH ASA AND BRILINTA X 12 MONTHS MINIMUM  GUIDELINE DIRECTED MEDICAL THERAPY  IF NO COMPLICATIONS ANTICIPATE 72 HOUR HOSPITAL STAY SECONDARY TO LARGE ANTERIOR WALL MI     Treatments: See Hospital Course  Discharge Exam: Blood pressure 107/62, pulse 76, temperature 98.5 F (36.9 C), temperature source Oral, resp. rate 16, height 5\' 8"  (1.727 m), weight 144 lb (65.318 kg), SpO2 94 %.   Disposition: 01-Home or Self Care      Discharge Instructions    Diet - low sodium heart healthy    Complete by:  As directed      Increase activity slowly    Complete by:  As directed             Medication List    STOP taking these medications        traMADol 50 MG tablet  Commonly known as:  ULTRAM      TAKE these medications        aspirin 81 MG EC tablet  Take 1 tablet (81 mg total) by mouth daily.     atorvastatin 80 MG tablet  Commonly known as:  LIPITOR  Take 1 tablet (80 mg total) by mouth daily at 6 PM.     carvedilol 3.125 MG tablet  Commonly known as:  COREG  Take 1 tablet (3.125 mg total) by mouth 2 (two) times daily with a meal.     lisinopril 2.5  MG tablet  Commonly known as:  PRINIVIL,ZESTRIL  Take 1 tablet (2.5 mg total) by mouth every evening.     nitroGLYCERIN 0.4 MG SL tablet  Commonly known as:  NITROSTAT  Place 1 tablet (0.4 mg total) under the tongue every 5 (five) minutes x 3 doses as needed for chest pain.     spironolactone 25 MG tablet  Commonly known as:  ALDACTONE  Take 0.5 tablets (12.5 mg total) by mouth daily.     ticagrelor 90 MG Tabs tablet  Commonly known as:  BRILINTA  Take 1 tablet (90 mg total) by mouth 2 (two) times daily.     ticagrelor 90 MG Tabs tablet  Commonly known as:  BRILINTA  Take 1 tablet (90 mg total) by mouth 2 (two) times daily.       Follow-up Information    Follow up with Abelino Derrick, PA-C On 07/30/2015.   Specialties:  Cardiology, Radiology   Why:  8:30 am (cardiology follow-up)   Contact information:   [address] [place] [address] [phone]     TIME SPENT ON DISCHARGE, INCLUDING PHYSICIAN TIME: >30 MINUTES  Signed: Robbie Lis 07/23/2015, 1:57 PM  Attending Note:   The patient was seen and examined.  Agree with assessment and plan as noted above.  Changes made to the above note as needed.  See my progress note from earlier today    Alvia Grove., MD, [facility] 07/23/2015, 6:08 PM [address],  [address] Office [phone] Pager [phone]

## 2015-07-24 NOTE — Telephone Encounter (Signed)
Left message for patient to call back  

## 2015-07-25 NOTE — Telephone Encounter (Signed)
Tried calling pt but no answer. Also tried his daughter Melene Plan) and left message at her home.

## 2015-07-25 NOTE — Telephone Encounter (Signed)
Sister Melene Plan) called back and gave [place] his correct home phone number.  She states he is HOH.  Called 3 different times before he answered the phone. He is TCM.  Was d/c 8/22.  States he is doing good.  No c/o of chest pain. States someone comes over to set up his medications and his case worker will bring him to the appointment 9/2 at 9:30. States he has all of his medicines and advised to bring his medications to appointment. To call if has any problems.

## 2015-07-30 ENCOUNTER — Encounter: Payer: Medicare Other | Admitting: Cardiology

## 2015-08-03 ENCOUNTER — Encounter: Payer: Self-pay | Admitting: Nurse Practitioner

## 2015-08-03 ENCOUNTER — Ambulatory Visit (INDEPENDENT_AMBULATORY_CARE_PROVIDER_SITE_OTHER): Payer: Medicare Other | Admitting: Nurse Practitioner

## 2015-08-03 VITALS — BP 119/80 | HR 72 | Wt 135.8 lb

## 2015-08-03 DIAGNOSIS — I2109 ST elevation (STEMI) myocardial infarction involving other coronary artery of anterior wall: Secondary | ICD-10-CM

## 2015-08-03 DIAGNOSIS — I255 Ischemic cardiomyopathy: Secondary | ICD-10-CM | POA: Insufficient documentation

## 2015-08-03 DIAGNOSIS — I2102 ST elevation (STEMI) myocardial infarction involving left anterior descending coronary artery: Secondary | ICD-10-CM

## 2015-08-03 DIAGNOSIS — I2510 Atherosclerotic heart disease of native coronary artery without angina pectoris: Secondary | ICD-10-CM

## 2015-08-03 DIAGNOSIS — E785 Hyperlipidemia, unspecified: Secondary | ICD-10-CM | POA: Diagnosis not present

## 2015-08-03 LAB — BASIC METABOLIC PANEL
BUN: 16 mg/dL (ref 6–23)
CHLORIDE: 107 meq/L (ref 96–112)
CO2: 27 meq/L (ref 19–32)
CREATININE: 0.95 mg/dL (ref 0.40–1.50)
Calcium: 9 mg/dL (ref 8.4–10.5)
GFR: 80.56 mL/min (ref >60.00)
GLUCOSE: 113 mg/dL — AB (ref 70–99)
Potassium: 4.1 mEq/L (ref 3.5–5.1)
Sodium: 143 mEq/L (ref 135–145)

## 2015-08-03 NOTE — Patient Instructions (Signed)
Medication Instructions:  Your physician recommends that you continue on your current medications as directed. Please refer to the Current Medication list given to you today.   Labwork: Your physician recommends that you have lab work: bmet   Testing/Procedures: -None  Follow-Up: Your physician recommends that you schedule a follow-up appointment in: [place] with any provider please   Any Other Special Instructions Will Be Listed Below (If Applicable).

## 2015-08-03 NOTE — Progress Notes (Signed)
Patient Name: Justin Figueroa Date of Encounter: 08/03/2015  Primary Care Provider:  No primary care provider on file. Primary Cardiologist:  Pt will f/u in [place] office.  Chief Complaint  81 year old male without prior cardiac history who presents on follow-up after recent hospital patient for anterior ST elevation MI and stenting.  Past Medical History   Past Medical History  Diagnosis Date  . CAD (coronary artery disease)     a. 07/2015 Ant STEMI/PCI: LM nl, LAD 70/100 (3.0x28 Promus DES), LCX 106m, RCA 50p. EF 35%.  . Ischemic cardiomyopathy     a. 07/2015 Echo: EF 25%, diff HK, mildly dil RV w/ mildly reduced fxn, mildly dil LA.  Marland Kitchen Hyperlipidemia    Past Surgical History  Procedure Laterality Date  . Cardiac catheterization N/A 07/19/2015    Procedure: Left Heart Cath and Coronary Angiography;  Surgeon: Tonny Bollman, MD;  Location: [facility] INVASIVE CV LAB;  Service: Cardiovascular;  Laterality: N/A;    Allergies  No Known Allergies  HPI  81 year old male with the above past medical history. He did not have a prior history of coronary artery disease. He was admitted to [facility] earlier this month with chest pain and anterior ST segment elevation. Catheterization revealed a total occlusion of the proximal LAD with otherwise nonobstructive disease. He did have moderate LV dysfunction. The LAD was successfully stented. Echo showed an EF of 25% with diffuse hypokinesis. He was not felt to be candidate for a LifeVest secondary to baseline nondescript psychological disorder. Since discharge, he reports doing well without chest pain or dyspnea. He does not weigh himself though believes his weight is been stable. He denies PND, orthopnea, dizziness, sick B, edema, or early satiety.  Home Medications  Prior to Admission medications   Medication Sig Start Date End Date Taking? Authorizing Provider  atorvastatin (LIPITOR) 80 MG tablet Take 1 tablet (80 mg total) by mouth daily at 6 PM.  07/23/15  Yes Brittainy Sherlynn Carbon, PA-C  carvedilol (COREG) 3.125 MG tablet Take 1 tablet (3.125 mg total) by mouth 2 (two) times daily with a meal. 07/23/15  Yes Brittainy Sherlynn Carbon, PA-C  lisinopril (PRINIVIL,ZESTRIL) 2.5 MG tablet Take 1 tablet (2.5 mg total) by mouth every evening. 07/23/15  Yes Brittainy Sherlynn Carbon, PA-C  nitroGLYCERIN (NITROSTAT) 0.4 MG SL tablet Place 1 tablet (0.4 mg total) under the tongue every 5 (five) minutes x 3 doses as needed for chest pain. 07/23/15  Yes Brittainy Sherlynn Carbon, PA-C  spironolactone (ALDACTONE) 25 MG tablet Take 0.5 tablets (12.5 mg total) by mouth daily. 07/23/15  Yes Brittainy Sherlynn Carbon, PA-C  ticagrelor (BRILINTA) 90 MG TABS tablet Take 1 tablet (90 mg total) by mouth 2 (two) times daily. 07/23/15  Yes Brittainy Sherlynn Carbon, PA-C  aspirin EC 81 MG EC tablet Take 1 tablet (81 mg total) by mouth daily. 07/23/15   Brittainy Sherlynn Carbon, PA-C    Review of Systems  He denies chest pain, palpitations, dyspnea, pnd, orthopnea, n, v, dizziness, syncope, edema, weight gain, or early satiety.  All other systems reviewed and are otherwise negative except as noted above.  Physical Exam  VS:  BP 119/80 mmHg  Pulse 72  Wt 135 lb 12 oz (61.576 kg)  SpO2 96% , BMI Body mass index is 20.65 kg/(m^2). GEN: Well nourished, well developed, in no acute distress. HEENT: normal. Neck: Supple, no JVD, carotid bruits, or masses. Cardiac: RRR, no murmurs, rubs, or gallops. No clubbing, cyanosis, edema.  Radials/DP/PT 2+ and  equal bilaterally.  Respiratory:  Respirations regular and unlabored, clear to auscultation bilaterally. GI: Soft, nontender, nondistended, BS + x 4. MS: no deformity or atrophy. Skin: warm and dry, no rash. Neuro:  Strength and sensation are intact. Psych: Normal affect.  Accessory Clinical Findings  ECG - regular sinus rhythm, 70, frequent PVCs, inferior infarct, anterior infarct with persistent anterior ST segment elevation and T-wave inversion.  Lateral T-wave inversion.  Assessment & Plan  1.  Acute anterior ST segment elevation myocardial infarction, subsequent episode of care/coronary artery disease: Status post PCI and stenting of the LAD. He has done well without chest pain or dyspnea. He reports compliance with his medications. His social workers with him here today. Continue aspirin, statin, beta blocker, ACE inhibitor, spironolactone, and beta blocker therapy. I will check a basic metabolic panel today. He was briefly not felt to be a candidate for a LifeVest.  2. Ischemic cardiopathy: Euvolemic on exam today. Continue beta blocker, ACE inhibitor, spironolactone therapy. As above, he has not been felt to be a candidate for LifeVest secondary to underlying, nondescript psychological disorder. He does report positive medications. Meals are provided through [employer]. We discussed the importance of daily weights, sodium restriction, medication compliance, and symptom reporting and he verbalizes understanding.   3. Disposition: Follow-up in [place] office in 6-8 weeks.    Nicolasa Ducking, NP 08/03/2015, 6:49 PM

## 2015-09-03 ENCOUNTER — Other Ambulatory Visit: Payer: Self-pay | Admitting: *Deleted

## 2015-09-03 MED ORDER — LISINOPRIL 2.5 MG PO TABS: 2.5000 mg | ORAL_TABLET | Freq: Every evening | ORAL | Status: DC

## 2015-09-03 MED ORDER — ATORVASTATIN CALCIUM 80 MG PO TABS: 80.0000 mg | ORAL_TABLET | Freq: Every day | ORAL | Status: DC

## 2015-09-18 ENCOUNTER — Ambulatory Visit (INDEPENDENT_AMBULATORY_CARE_PROVIDER_SITE_OTHER): Payer: Medicare Other | Admitting: Cardiology

## 2015-09-18 ENCOUNTER — Encounter: Payer: Self-pay | Admitting: Cardiology

## 2015-09-18 VITALS — BP 100/64 | HR 69 | Ht 66.0 in | Wt 142.0 lb

## 2015-09-18 DIAGNOSIS — I255 Ischemic cardiomyopathy: Secondary | ICD-10-CM | POA: Diagnosis not present

## 2015-09-18 DIAGNOSIS — E785 Hyperlipidemia, unspecified: Secondary | ICD-10-CM | POA: Diagnosis not present

## 2015-09-18 DIAGNOSIS — Z5181 Encounter for therapeutic drug level monitoring: Secondary | ICD-10-CM | POA: Diagnosis not present

## 2015-09-18 DIAGNOSIS — I2510 Atherosclerotic heart disease of native coronary artery without angina pectoris: Secondary | ICD-10-CM

## 2015-09-18 DIAGNOSIS — I2109 ST elevation (STEMI) myocardial infarction involving other coronary artery of anterior wall: Secondary | ICD-10-CM | POA: Diagnosis not present

## 2015-09-18 NOTE — Patient Instructions (Signed)
Your physician recommends that you continue on your current medications as directed. Please refer to the Current Medication list given to you today. Your physician recommends that you return for a FASTING lipid/liver profile: in 3 months just before your next visit in January 2017. Your physician recommends that you schedule a follow-up appointment in: 3 months.

## 2015-09-18 NOTE — Progress Notes (Signed)
Cardiology Office Note  Date: 09/18/2015   ID: [name], DOB 03/02/1931, MRN [id number]  PCP: Justin Peri, MD  Primary Cardiologist: Justin Dell, MD   Chief Complaint  Patient presents with  . Coronary Artery Disease  . Cardiomyopathy    History of Present Illness: Justin Figueroa is an 81 y.o. male presenting for a routine office visit. This is my first meeting with him. I reviewed his records including recent office follow-up with Mr. Justin Aliment NP in [place]. He is here today with an Justin Figueroa from [facility].  He does not report any active angina symptoms and tells me that he has been taking his medications as directed. I reviewed his list, outlined below. We discussed the importance of compliance, particularly with DAPT in light of his recent stent placement. He denies any significant bleeding problems.  My understanding is that he is following with Dr. Sherryll Figueroa for primary care.  Cardiac rehabilitation is not planned at this time. We did discuss his basic ADLs and functional capacity. He uses a cane, denies any recent falls. He is otherwise not active at baseline.  He has a cardiomyopathy based on recent evaluation with LVEF 25% and overall diffuse hypokinesis. Question how much of this is related to ischemic Figueroa disease, and potentially may show improvement following revascularization and on medical therapy.   Past Medical History  Diagnosis Date  . CAD (coronary artery disease)     a. 07/2015 Ant STEMI/PCI: LM nl, LAD 70/100 (3.0x28 Promus DES), LCX 62m, RCA 50p. EF 35%.  . Ischemic cardiomyopathy     a. 07/2015 Echo: EF 25%, diff HK, mildly dil RV w/ mildly reduced fxn, mildly dil LA.  Marland Kitchen Hyperlipidemia     Past Surgical History  Procedure Laterality Date  . Cardiac catheterization N/A 07/19/2015    Procedure: Left Figueroa Cath and Coronary Angiography;  Surgeon: Justin Bollman, MD;  Location: [facility] INVASIVE CV LAB;  Service: Cardiovascular;  Laterality:  N/A;    Current Outpatient Prescriptions  Medication Sig Dispense Refill  . aspirin EC 81 MG EC tablet Take 1 tablet (81 mg total) by mouth daily.    . carvedilol (COREG) 3.125 MG tablet Take 1 tablet (3.125 mg total) by mouth 2 (two) times daily with a meal. 60 tablet 5  . lisinopril (PRINIVIL,ZESTRIL) 2.5 MG tablet Take 1 tablet (2.5 mg total) by mouth every evening. 90 tablet 0  . nitroGLYCERIN (NITROSTAT) 0.4 MG SL tablet Place 1 tablet (0.4 mg total) under the tongue every 5 (five) minutes x 3 doses as needed for chest pain. 25 tablet 2  . spironolactone (ALDACTONE) 25 MG tablet Take 0.5 tablets (12.5 mg total) by mouth daily. 30 tablet 5  . ticagrelor (BRILINTA) 90 MG TABS tablet Take 1 tablet (90 mg total) by mouth 2 (two) times daily. 60 tablet 10   No current facility-administered medications for this visit.    Allergies:  Review of patient's allergies indicates no known allergies.   Social History: The patient  reports that he has never smoked. He has never used smokeless tobacco. He reports that he does not drink alcohol or use illicit drugs.   ROS:  Please see the history of present illness. Otherwise, complete review of systems is positive for decreased hearing.  All other systems are reviewed and negative.   Physical Exam: VS:  BP 100/64 mmHg  Pulse 69  Ht  (1.676 m)  Wt 142 lb (64.411 kg)  BMI 22.93 kg/m2  SpO2 95%,  BMI Body mass index is 22.93 kg/(m^2).  Wt Readings from Last 3 Encounters:  09/18/15 142 lb (64.411 kg)  08/03/15 135 lb 12 oz (61.576 kg)  07/23/15 144 lb (65.318 kg)     General: Somewhat disheveled male, appears comfortable at rest. HEENT: Conjunctiva and lids normal, oropharynx clear with poor dentition. Neck: Supple, no elevated JVP or carotid bruits, no thyromegaly. Lungs: Clear to auscultation, nonlabored breathing at rest. Cardiac: Regular rate and rhythm, no S3 or significant systolic murmur, no pericardial rub. Abdomen: Soft,  nontender, bowel sounds present. Extremities: No pitting edema, distal pulses 2+. Skin: Warm and dry. Musculoskeletal: Mild kyphosis. Neuropsychiatric: Alert and oriented x3, affect grossly appropriate. Decreased hearing.   ECG:  Tracing from 08/03/2015 showed sinus rhythm with PVCs and evolutionary changes of anterior wall infarct.   Recent Labwork: 07/19/2015: TSH 1.032 07/20/2015: B Natriuretic Peptide 682.1* 07/22/2015: Hemoglobin 13.2; Platelets 191 08/03/2015: BUN 16; Creatinine, Ser 0.95; Potassium 4.1; Sodium 143     Component Value Date/Time   CHOL 153 07/20/2015 0309   TRIG 118 07/20/2015 0309   HDL 25* 07/20/2015 0309   CHOLHDL 6.1 07/20/2015 0309   VLDL 24 07/20/2015 0309   LDLCALC 104* 07/20/2015 0309    Other Studies Reviewed Today:  Echocardiogram 07/20/2015: Study Conclusions  - Left ventricle: The cavity size was normal. Wall thickness was normal. The estimated ejection fraction was 25%. Diffuse hypokinesis. - Right ventricle: The cavity size was mildly dilated. Systolic function was mildly reduced. - Right atrium: The atrium was mildly dilated.  Assessment and Plan:  1. CAD status post anterior STEMI as outlined above, managed with DES to the LAD and the addition of medical therapy. He is clinically stable at this time. He does report compliance with his medications, and this was reinforced today.  2. Cardiomyopathy with LVEF 25%, diffuse hypokinesis by echocardiogram. If related to ischemic Figueroa disease, hopefully we will see some improvement following revascularization and on regular medical therapy. We will plan to follow-up with an echocardiogram around the time of his next visit.  3. Hyperlipidemia, on statin therapy. Follow-up FLP and LFT for next visit.  Current medicines were reviewed with the patient today.   Orders Placed This Encounter  Procedures  . Hepatic function panel  . Lipid panel    Disposition: FU with me in 3  months.   Signed, Jonelle SidleSamuel G. Aleathea Pugmire, MD, Justin [facility]eroa 09/18/2015 10:39 AM    [facility] [address], [place], [place] [address] Phone: [phone]; Fax: [phone]

## 2015-10-08 ENCOUNTER — Inpatient Hospital Stay (HOSPITAL_COMMUNITY): Payer: Medicare Other

## 2015-10-08 ENCOUNTER — Encounter (HOSPITAL_COMMUNITY): Payer: Self-pay | Admitting: Emergency Medicine

## 2015-10-08 ENCOUNTER — Inpatient Hospital Stay (HOSPITAL_COMMUNITY)
Admission: EM | Admit: 2015-10-08 | Discharge: 2015-10-12 | DRG: 056 | Disposition: A | Discharge status: Skilled Nursing Facility | Payer: Medicare Other | Attending: Internal Medicine | Admitting: Internal Medicine

## 2015-10-08 ENCOUNTER — Emergency Department (HOSPITAL_COMMUNITY): Payer: Medicare Other

## 2015-10-08 DIAGNOSIS — Z87440 Personal history of urinary (tract) infections: Secondary | ICD-10-CM

## 2015-10-08 DIAGNOSIS — B9689 Other specified bacterial agents as the cause of diseases classified elsewhere: Secondary | ICD-10-CM | POA: Diagnosis not present

## 2015-10-08 DIAGNOSIS — R319 Hematuria, unspecified: Secondary | ICD-10-CM | POA: Diagnosis present

## 2015-10-08 DIAGNOSIS — R4182 Altered mental status, unspecified: Secondary | ICD-10-CM | POA: Diagnosis not present

## 2015-10-08 DIAGNOSIS — I5022 Chronic systolic (congestive) heart failure: Secondary | ICD-10-CM | POA: Diagnosis present

## 2015-10-08 DIAGNOSIS — Z8249 Family history of ischemic heart disease and other diseases of the circulatory system: Secondary | ICD-10-CM

## 2015-10-08 DIAGNOSIS — R059 Cough, unspecified: Secondary | ICD-10-CM

## 2015-10-08 DIAGNOSIS — I2102 ST elevation (STEMI) myocardial infarction involving left anterior descending coronary artery: Secondary | ICD-10-CM | POA: Diagnosis present

## 2015-10-08 DIAGNOSIS — R569 Unspecified convulsions: Secondary | ICD-10-CM

## 2015-10-08 DIAGNOSIS — Z79899 Other long term (current) drug therapy: Secondary | ICD-10-CM

## 2015-10-08 DIAGNOSIS — I2510 Atherosclerotic heart disease of native coronary artery without angina pectoris: Secondary | ICD-10-CM | POA: Diagnosis present

## 2015-10-08 DIAGNOSIS — Z7901 Long term (current) use of anticoagulants: Secondary | ICD-10-CM

## 2015-10-08 DIAGNOSIS — J189 Pneumonia, unspecified organism: Secondary | ICD-10-CM | POA: Diagnosis present

## 2015-10-08 DIAGNOSIS — G40909 Epilepsy, unspecified, not intractable, without status epilepticus: Secondary | ICD-10-CM | POA: Diagnosis present

## 2015-10-08 DIAGNOSIS — A523 Neurosyphilis, unspecified: Secondary | ICD-10-CM | POA: Diagnosis present

## 2015-10-08 DIAGNOSIS — J9811 Atelectasis: Secondary | ICD-10-CM | POA: Diagnosis present

## 2015-10-08 DIAGNOSIS — G9340 Encephalopathy, unspecified: Secondary | ICD-10-CM | POA: Diagnosis present

## 2015-10-08 DIAGNOSIS — I255 Ischemic cardiomyopathy: Secondary | ICD-10-CM | POA: Diagnosis present

## 2015-10-08 DIAGNOSIS — R079 Chest pain, unspecified: Secondary | ICD-10-CM | POA: Diagnosis not present

## 2015-10-08 DIAGNOSIS — E785 Hyperlipidemia, unspecified: Secondary | ICD-10-CM | POA: Diagnosis present

## 2015-10-08 DIAGNOSIS — N419 Inflammatory disease of prostate, unspecified: Secondary | ICD-10-CM | POA: Diagnosis present

## 2015-10-08 DIAGNOSIS — F99 Mental disorder, not otherwise specified: Secondary | ICD-10-CM

## 2015-10-08 DIAGNOSIS — H9190 Unspecified hearing loss, unspecified ear: Secondary | ICD-10-CM | POA: Diagnosis present

## 2015-10-08 DIAGNOSIS — R410 Disorientation, unspecified: Secondary | ICD-10-CM | POA: Diagnosis present

## 2015-10-08 DIAGNOSIS — Z7982 Long term (current) use of aspirin: Secondary | ICD-10-CM | POA: Diagnosis not present

## 2015-10-08 DIAGNOSIS — R05 Cough: Secondary | ICD-10-CM

## 2015-10-08 DIAGNOSIS — I252 Old myocardial infarction: Secondary | ICD-10-CM

## 2015-10-08 DIAGNOSIS — N418 Other inflammatory diseases of prostate: Secondary | ICD-10-CM | POA: Diagnosis not present

## 2015-10-08 HISTORY — DX: Altered mental status, unspecified: R41.82

## 2015-10-08 LAB — TSH: TSH: 2.813 u[IU]/mL (ref 0.350–4.500)

## 2015-10-08 LAB — URINALYSIS, ROUTINE W REFLEX MICROSCOPIC
Bilirubin Urine: NEGATIVE
Glucose, UA: NEGATIVE mg/dL
Ketones, ur: 40 mg/dL — AB
LEUKOCYTES UA: NEGATIVE
NITRITE: NEGATIVE
PH: 8.5 — AB (ref 5.0–8.0)
Protein, ur: NEGATIVE mg/dL
SPECIFIC GRAVITY, URINE: 1.013 (ref 1.005–1.030)
Urobilinogen, UA: 1 mg/dL (ref 0.0–1.0)

## 2015-10-08 LAB — COMPREHENSIVE METABOLIC PANEL
ALBUMIN: 2.1 g/dL — AB (ref 3.5–5.0)
ALK PHOS: 52 U/L (ref 38–126)
ALT: 27 U/L (ref 17–63)
AST: 58 U/L — AB (ref 15–41)
Anion gap: 7 (ref 5–15)
BILIRUBIN TOTAL: 1.4 mg/dL — AB (ref 0.3–1.2)
CALCIUM: 7.5 mg/dL — AB (ref 8.9–10.3)
CO2: 28 mmol/L (ref 22–32)
Chloride: 104 mmol/L (ref 101–111)
Creatinine, Ser: 0.7 mg/dL (ref 0.61–1.24)
GFR calc Af Amer: >60 mL/min (ref >60)
GFR calc non Af Amer: >60 mL/min (ref >60)
GLUCOSE: 85 mg/dL (ref 65–99)
Potassium: 4.3 mmol/L (ref 3.5–5.1)
SODIUM: 139 mmol/L (ref 135–145)
TOTAL PROTEIN: 5.3 g/dL — AB (ref 6.5–8.1)

## 2015-10-08 LAB — CBC WITH DIFFERENTIAL/PLATELET
Basophils Absolute: 0 10*3/uL (ref 0.0–0.1)
Basophils Relative: 0 %
EOS ABS: 0.5 10*3/uL (ref 0.0–0.7)
EOS PCT: 4 %
HEMATOCRIT: 40.6 % (ref 39.0–52.0)
Hemoglobin: 13.3 g/dL (ref 13.0–17.0)
LYMPHS PCT: 6 %
Lymphs Abs: 0.8 10*3/uL (ref 0.7–4.0)
MCH: 28.7 pg (ref 26.0–34.0)
MCHC: 32.8 g/dL (ref 30.0–36.0)
MCV: 87.7 fL (ref 78.0–100.0)
MONO ABS: 0.5 10*3/uL (ref 0.1–1.0)
Monocytes Relative: 4 %
NEUTROS ABS: 11.7 10*3/uL — AB (ref 1.7–7.7)
Neutrophils Relative %: 86 %
PLATELETS: 381 10*3/uL (ref 150–400)
RBC: 4.63 MIL/uL (ref 4.22–5.81)
RDW: 16.4 % — ABNORMAL HIGH (ref 11.5–15.5)
WBC: 13.5 10*3/uL — ABNORMAL HIGH (ref 4.0–10.5)

## 2015-10-08 LAB — AMMONIA: AMMONIA: 20 umol/L (ref 9–35)

## 2015-10-08 LAB — RAPID URINE DRUG SCREEN, HOSP PERFORMED
Amphetamines: NOT DETECTED
Barbiturates: NOT DETECTED
Benzodiazepines: POSITIVE — AB
COCAINE: NOT DETECTED
OPIATES: NOT DETECTED
TETRAHYDROCANNABINOL: NOT DETECTED

## 2015-10-08 LAB — LIPASE, BLOOD: Lipase: 78 U/L — ABNORMAL HIGH (ref 11–51)

## 2015-10-08 LAB — TROPONIN I: Troponin I: 0.06 ng/mL — ABNORMAL HIGH (ref <0.031)

## 2015-10-08 LAB — VITAMIN B12: VITAMIN B 12: 736 pg/mL (ref 180–914)

## 2015-10-08 LAB — URINE MICROSCOPIC-ADD ON

## 2015-10-08 MED ORDER — SENNOSIDES-DOCUSATE SODIUM 8.6-50 MG PO TABS: 1.0000 | ORAL_TABLET | Freq: Every evening | ORAL | Status: DC | PRN

## 2015-10-08 MED ORDER — ONDANSETRON HCL 4 MG/2ML IJ SOLN: 4.0000 mg | Freq: Four times a day (QID) | INTRAMUSCULAR | Status: DC | PRN

## 2015-10-08 MED ORDER — PIPERACILLIN-TAZOBACTAM 3.375 G IVPB
3.3750 g | Freq: Three times a day (TID) | INTRAVENOUS | Status: DC
  Administered 2015-10-08 – 2015-10-11 (×9): 3.375 g via INTRAVENOUS
  Filled 2015-10-08 (×11): qty 50

## 2015-10-08 MED ORDER — ACETAMINOPHEN 650 MG RE SUPP: 650.0000 mg | Freq: Four times a day (QID) | RECTAL | Status: DC | PRN

## 2015-10-08 MED ORDER — ALUM & MAG HYDROXIDE-SIMETH 200-200-20 MG/5ML PO SUSP: 30.0000 mL | Freq: Four times a day (QID) | ORAL | Status: DC | PRN

## 2015-10-08 MED ORDER — ACETAMINOPHEN 500 MG PO TABS: 500.0000 mg | ORAL_TABLET | Freq: Four times a day (QID) | ORAL | Status: DC | PRN

## 2015-10-08 MED ORDER — SODIUM CHLORIDE 0.9 % IJ SOLN: 3.0000 mL | Freq: Two times a day (BID) | INTRAMUSCULAR | Status: DC

## 2015-10-08 MED ORDER — LISINOPRIL 2.5 MG PO TABS
2.5000 mg | ORAL_TABLET | Freq: Every evening | ORAL | Status: DC
  Administered 2015-10-09 – 2015-10-12 (×3): 2.5 mg via ORAL
  Filled 2015-10-08 (×6): qty 1

## 2015-10-08 MED ORDER — ALUM & MAG HYDROXIDE-SIMETH 200-200-20 MG/5ML PO SUSP
30.0000 mL | Freq: Four times a day (QID) | ORAL | Status: DC | PRN
  Administered 2015-10-10: 30 mL via ORAL
  Filled 2015-10-08: qty 30

## 2015-10-08 MED ORDER — ACETAMINOPHEN 325 MG PO TABS: 650.0000 mg | ORAL_TABLET | Freq: Four times a day (QID) | ORAL | Status: DC | PRN

## 2015-10-08 MED ORDER — SODIUM CHLORIDE 0.9 % IV SOLN: 500.0000 mg | Freq: Two times a day (BID) | INTRAVENOUS | Status: DC

## 2015-10-08 MED ORDER — ONDANSETRON HCL 4 MG PO TABS: 4.0000 mg | ORAL_TABLET | Freq: Four times a day (QID) | ORAL | Status: DC | PRN

## 2015-10-08 MED ORDER — LORAZEPAM 2 MG/ML IJ SOLN: 4.0000 mg | INTRAMUSCULAR | Status: AC

## 2015-10-08 MED ORDER — ENOXAPARIN SODIUM 40 MG/0.4ML ~~LOC~~ SOLN: 40.0000 mg | SUBCUTANEOUS | Status: DC

## 2015-10-08 MED ORDER — MORPHINE SULFATE (PF) 2 MG/ML IV SOLN: 2.0000 mg | INTRAVENOUS | Status: DC | PRN

## 2015-10-08 MED ORDER — ALPRAZOLAM 0.5 MG PO TABS
0.5000 mg | ORAL_TABLET | Freq: Four times a day (QID) | ORAL | Status: DC | PRN
  Administered 2015-10-08: 0.5 mg via ORAL
  Filled 2015-10-08: qty 1

## 2015-10-08 MED ORDER — PIPERACILLIN-TAZOBACTAM 3.375 G IVPB 30 MIN
3.3750 g | Freq: Once | INTRAVENOUS | Status: DC
  Filled 2015-10-08: qty 50

## 2015-10-08 MED ORDER — MAGIC MOUTHWASH
10.0000 mL | Freq: Four times a day (QID) | ORAL | Status: DC
  Administered 2015-10-08 – 2015-10-09 (×3): 10 mL via ORAL
  Filled 2015-10-08 (×6): qty 10

## 2015-10-08 MED ORDER — BISACODYL 10 MG RE SUPP: 10.0000 mg | Freq: Every day | RECTAL | Status: DC | PRN

## 2015-10-08 MED ORDER — SODIUM CHLORIDE 0.9 % IJ SOLN
3.0000 mL | Freq: Two times a day (BID) | INTRAMUSCULAR | Status: DC
  Administered 2015-10-08: 3 mL via INTRAVENOUS

## 2015-10-08 MED ORDER — ASPIRIN EC 81 MG PO TBEC
81.0000 mg | DELAYED_RELEASE_TABLET | Freq: Every day | ORAL | Status: DC
  Administered 2015-10-09 – 2015-10-12 (×3): 81 mg via ORAL
  Filled 2015-10-08 (×4): qty 1

## 2015-10-08 MED ORDER — SENNOSIDES-DOCUSATE SODIUM 8.6-50 MG PO TABS
1.0000 | ORAL_TABLET | Freq: Every evening | ORAL | Status: DC | PRN
Start: 2015-10-08 — End: 2015-10-13

## 2015-10-08 MED ORDER — ENOXAPARIN SODIUM 40 MG/0.4ML ~~LOC~~ SOLN
40.0000 mg | SUBCUTANEOUS | Status: DC
  Administered 2015-10-08 – 2015-10-09 (×2): 40 mg via SUBCUTANEOUS
  Filled 2015-10-08 (×2): qty 0.4

## 2015-10-08 MED ORDER — SODIUM CHLORIDE 0.9 % IV SOLN: 250.0000 mL | INTRAVENOUS | Status: DC | PRN

## 2015-10-08 MED ORDER — FOLIC ACID 1 MG PO TABS
1.0000 mg | ORAL_TABLET | Freq: Every day | ORAL | Status: DC
  Administered 2015-10-08 – 2015-10-12 (×4): 1 mg via ORAL
  Filled 2015-10-08 (×5): qty 1

## 2015-10-08 MED ORDER — CARVEDILOL 3.125 MG PO TABS
3.1250 mg | ORAL_TABLET | Freq: Two times a day (BID) | ORAL | Status: DC
  Administered 2015-10-09 – 2015-10-12 (×5): 3.125 mg via ORAL
  Filled 2015-10-08 (×7): qty 1

## 2015-10-08 MED ORDER — SODIUM CHLORIDE 0.9 % IV SOLN
75.0000 mL/h | INTRAVENOUS | Status: AC
  Administered 2015-10-08: 75 mL/h via INTRAVENOUS

## 2015-10-08 MED ORDER — HALOPERIDOL LACTATE 5 MG/ML IJ SOLN: 1.0000 mg | Freq: Four times a day (QID) | INTRAMUSCULAR | Status: DC | PRN

## 2015-10-08 MED ORDER — PIPERACILLIN-TAZOBACTAM 3.375 G IVPB 30 MIN: 3.3750 g | Freq: Once | INTRAVENOUS | Status: DC

## 2015-10-08 MED ORDER — PROMETHAZINE HCL 25 MG PO TABS: 12.5000 mg | ORAL_TABLET | Freq: Four times a day (QID) | ORAL | Status: DC | PRN

## 2015-10-08 MED ORDER — NITROGLYCERIN 0.4 MG SL SUBL
0.4000 mg | SUBLINGUAL_TABLET | SUBLINGUAL | Status: DC | PRN
  Administered 2015-10-10 (×2): 0.4 mg via SUBLINGUAL
  Filled 2015-10-08 (×2): qty 1

## 2015-10-08 MED ORDER — SODIUM CHLORIDE 0.9 % IJ SOLN: 3.0000 mL | INTRAMUSCULAR | Status: DC | PRN

## 2015-10-08 MED ORDER — SODIUM CHLORIDE 0.9 % IV SOLN
500.0000 mg | Freq: Two times a day (BID) | INTRAVENOUS | Status: DC
  Administered 2015-10-09 – 2015-10-10 (×2): 500 mg via INTRAVENOUS
  Filled 2015-10-08 (×5): qty 5

## 2015-10-08 MED ORDER — SODIUM CHLORIDE 0.9 % IV SOLN
1000.0000 mg | INTRAVENOUS | Status: AC
  Administered 2015-10-08: 1000 mg via INTRAVENOUS
  Filled 2015-10-08: qty 10

## 2015-10-08 MED ORDER — ASPIRIN EC 325 MG PO TBEC: 325.0000 mg | DELAYED_RELEASE_TABLET | Freq: Every day | ORAL | Status: DC

## 2015-10-08 MED ORDER — TICAGRELOR 90 MG PO TABS
90.0000 mg | ORAL_TABLET | Freq: Two times a day (BID) | ORAL | Status: DC
  Administered 2015-10-08 – 2015-10-12 (×8): 90 mg via ORAL
  Filled 2015-10-08 (×9): qty 1

## 2015-10-08 MED ORDER — VITAMIN B-1 100 MG PO TABS
100.0000 mg | ORAL_TABLET | Freq: Every day | ORAL | Status: DC
  Administered 2015-10-08 – 2015-10-12 (×4): 100 mg via ORAL
  Filled 2015-10-08 (×5): qty 1

## 2015-10-08 MED ORDER — LORAZEPAM 2 MG/ML IJ SOLN: 1.0000 mg | Freq: Once | INTRAMUSCULAR | Status: DC

## 2015-10-08 NOTE — ED Notes (Signed)
Admit Provider at bedside. 

## 2015-10-08 NOTE — ED Notes (Signed)
Send via EMS LLQ and RLQ abdominal pain, UTI, and cough. Alert answering and following commands appropriate. History of AMS.

## 2015-10-08 NOTE — ED Notes (Signed)
Called staffing for Recruitment consultantsafety sitter.

## 2015-10-08 NOTE — Progress Notes (Signed)
Pt. arrived at 1830, via bed with sitter, given handbook and admitted to floor.

## 2015-10-08 NOTE — H&P (Signed)
[facility] History and Physical                                                                                    Justin Figueroa, is a 81 y.o. male  MRN: [id number]   DOB - [birth year]  Admit Date - 10/08/2015  Outpatient Primary MD for the patient is Kirstie Peri, MD  Referring Physician:  Shanna Cisco, PA-C  Chief Complaint:  Acute encephalopathy and chest pain  HPI  Justin Figueroa  is a 81 y.o. male, with ischemic cardiomyopathy, recent anterior STEMI in August 2016, who was discharged from [facility] on 11/4 after a lengthy stay for community-acquired pneumonia and prostatitis. Today he presents to [facility] ER from [facility] nursing facility for acute encephalopathy and chest pain. History was gathered from the records, the patient's family, and the RN at [facility] nursing facility as the patient is completely altered and unable to provide history. Previously the patient lived at home alone. He went to [facility] where he was treated for dehydration, diarrhea, prostatitis, community acquired pneumonia. Friday, 11/4, he was discharged on Zosyn to  [facility] nursing facility. Per his nurse he did well over the weekend on IV antibiotics, calm and appropriate. This morning he was yelling obscenities and completely altered. Suddenly he displayed seizure-like activity which the RN described as assuming the fetal position with his hands over his ears and groaning for approximately 2-1/2 minutes. Afterward he fell asleep. Per his family (sister [name]) he was conversational on Saturday, but is prone to having inappropriate fits and is hard of hearing.   Further, [name] tells me the patient has a history of epilepsy but she does not know what antiepileptic medications he is on.  I called [place] pharmacy he does not appear to have been on any antiepileptic medications according to the records.   In the emergency department at [facility] the patient is able to tell me he has chest pain. He calls me a liar  when I reassure him that he is a [facility]. Otherwise his speech is unintelligible. He continuously moves and chatters throughout my exam and is very hard of hearing. WBC is 13.5, LFTs are slightly elevated.   I am unable to obtain a review of systems due to the patient's altered mental status.   Past Medical History  Past Medical History  Diagnosis Date  . CAD (coronary artery disease)     a. 07/2015 Ant STEMI/PCI: LM nl, LAD 70/100 (3.0x28 Promus DES), LCX 89m, RCA 50p. EF 35%.  . Ischemic cardiomyopathy     a. 07/2015 Echo: EF 25%, diff HK, mildly dil RV w/ mildly reduced fxn, mildly dil LA.  Marland Kitchen Hyperlipidemia   . Altered mental status     Past Surgical History  Procedure Laterality Date  . Cardiac catheterization N/A 07/19/2015    Procedure: Left Heart Cath and Coronary Angiography;  Surgeon: Tonny Bollman, MD;  Location: [facility] INVASIVE CV LAB;  Service: Cardiovascular;  Laterality: N/A;      Social History Social History  Substance Use Topics  . Smoking status: Never Smoker   . Smokeless tobacco: Never Used  . Alcohol Use: No   currently resides  at [facility] skilled nursing.   Family History Family History  Problem Relation Age of Onset  . Heart disease Mother     Prior to Admission medications   Medication Sig Start Date End Date Taking? Authorizing Provider  acetaminophen (TYLENOL) 500 MG tablet Take 500 mg by mouth every 6 (six) hours as needed for mild pain.   Yes Historical Provider, MD  ALPRAZolam Prudy Feeler) 0.5 MG tablet Take 0.5 mg by mouth every 6 (six) hours as needed for anxiety.   Yes Historical Provider, MD  aspirin EC 81 MG EC tablet Take 1 tablet (81 mg total) by mouth daily. 07/23/15  Yes Brittainy Sherlynn Carbon, PA-C  atorvastatin (LIPITOR) 80 MG tablet Take 80 mg by mouth daily.   Yes Historical Provider, MD  calcium-vitamin D (OSCAL WITH D) 500-200 MG-UNIT tablet Take 1 tablet by mouth 2 (two) times daily.   Yes Historical Provider, MD  carvedilol (COREG)  3.125 MG tablet Take 1 tablet (3.125 mg total) by mouth 2 (two) times daily with a meal. 07/23/15  Yes Brittainy Sherlynn Carbon, PA-C  cholecalciferol (VITAMIN D) 1000 UNITS tablet Take 1,000 Units by mouth daily.   Yes Historical Provider, MD  lisinopril (PRINIVIL,ZESTRIL) 2.5 MG tablet Take 1 tablet (2.5 mg total) by mouth every evening. 09/03/15  Yes Brittainy Sherlynn Carbon, PA-C  Multiple Vitamins-Minerals (MULTIVITAMIN WITH MINERALS) tablet Take 1 tablet by mouth daily.   Yes Historical Provider, MD  potassium chloride SA (K-DUR,KLOR-CON) 20 MEQ tablet Take 40 mEq by mouth 2 (two) times daily. For 7 days. Started on 10-05-15   Yes Historical Provider, MD  ticagrelor (BRILINTA) 90 MG TABS tablet Take 1 tablet (90 mg total) by mouth 2 (two) times daily. 07/23/15  Yes Brittainy Sherlynn Carbon, PA-C  nitroGLYCERIN (NITROSTAT) 0.4 MG SL tablet Place 1 tablet (0.4 mg total) under the tongue every 5 (five) minutes x 3 doses as needed for chest pain. 07/23/15   Brittainy Sherlynn Carbon, PA-C  spironolactone (ALDACTONE) 25 MG tablet Take 0.5 tablets (12.5 mg total) by mouth daily. 07/23/15   Brittainy Sherlynn Carbon, PA-C    No Known Allergies  Physical Exam  Vitals  Blood pressure 137/86, pulse 91, temperature 98.7 F (37.1 C), temperature source Oral, resp. rate 20, height 5\' 7"  (1.702 m), weight 63.504 kg (140 lb), SpO2 98 %.   Gen:  Elderly, thin malelying in bed,  rambling unintelligibly, rocking in the bed, appears in some distress.   Psych: Paranoid, awake but not oriented to place or time. poorly groomed, in-appropriate, but cooperative  Neuro:   Follows commands somewhat, able to move all 4 extremities, pupils are asymmetric, speech is mostly unintelligible  ENT: Eyes with asymmetric pupils, multiple tongue ulcerations.  Neck:  Supple, No lymphadenopathy appreciated  Respiratory:  Patient talks during auscultation but lungs sound generally clear with good air movement.  Cardiac:  RRR, No Murmurs, no LE edema  noted, no JVD.    Abdomen:  Positive bowel sounds, Soft, Non tender, Non distended,  No masses appreciated  Skin:  No Cyanosis, Normal Skin Turgor, No Skin Rash or Bruise.  Extremities:  Able to move all 4. 5/5 strength in each,  no effusions.  Data Review  Wt Readings from Last 3 Encounters:  10/08/15 63.504 kg (140 lb)  09/18/15 64.411 kg (142 lb)  08/03/15 61.576 kg (135 lb 12 oz)    CBC  Recent Labs Lab 10/08/15 1200  WBC 13.5*  HGB 13.3  HCT 40.6  PLT 381  MCV  87.7  MCH 28.7  MCHC 32.8  RDW 16.4*  LYMPHSABS 0.8  MONOABS 0.5  EOSABS 0.5  BASOSABS 0.0    Chemistries   Recent Labs Lab 10/08/15 1154  NA 139  K 4.3  CL 104  CO2 28  GLUCOSE 85  BUN <5*  CREATININE 0.70  CALCIUM 7.5*  AST 58*  ALT 27  ALKPHOS 52  BILITOT 1.4*     Lab Results  Component Value Date   HGBA1C 6.2* 07/19/2015    Urinalysis    Component Value Date/Time   COLORURINE YELLOW 10/08/2015 1140   APPEARANCEUR TURBID* 10/08/2015 1140   LABSPEC 1.013 10/08/2015 1140   PHURINE 8.5* 10/08/2015 1140   GLUCOSEU NEGATIVE 10/08/2015 1140   HGBUR LARGE* 10/08/2015 1140   BILIRUBINUR NEGATIVE 10/08/2015 1140   KETONESUR 40* 10/08/2015 1140   PROTEINUR NEGATIVE 10/08/2015 1140   UROBILINOGEN 1.0 10/08/2015 1140   NITRITE NEGATIVE 10/08/2015 1140   LEUKOCYTESUR NEGATIVE 10/08/2015 1140    Imaging results:   Dg Chest 2 View  10/08/2015  CLINICAL DATA:  Two week history of cough EXAM: CHEST  2 VIEW COMPARISON:  October 03, 2015 FINDINGS: There is persistent airspace consolidation in the right lower lobe, slightly less compared to study from 5 days previously. There are persistent rather minimal pleural effusions bilaterally. There is atelectatic change in the left base which is stable. Heart is enlarged with pulmonary vascular within normal limits. There is a sizable paraesophageal type hernia. No adenopathy. Bones are somewhat osteoporotic. There is superior migration of both  humeral heads consistent with chronic rotator cuff tears. IMPRESSION: Persistent right lower lobe airspace consolidation with pole minimally less opacity compared to 5 days previously. Atelectatic change left base. Minimal pleural effusions bilaterally. Stable cardiac prominence. Sizable paraesophageal hernia, stable. Evidence of chronic rotator cuff tears bilaterally. Electronically Signed   By: Bretta BangWilliam  Woodruff III M.D.   On: 10/08/2015 12:45    My personal review of EKG: Sinus rhythm, prolonged QT.   Assessment & Plan  Principal Problem:   Acute delirium Active Problems:   Chest pain   Seizure (HCC)   ST elevation (STEMI) myocardial infarction involving left anterior descending coronary artery [facility](HCC)   Psychiatric illness   Ischemic cardiomyopathy   Hyperlipidemia   Prostatitis   Acute delirium Uncertain etiology. Differential includes recent seizure, medications, infectious Will check CT head, UDS, ammonia level, TSH, RPR, B-12 Sitter at bedside. When necessary low-dose Haldol  (judiciously utilized as the patient has prolonged QT) Aspiration precautions, speech eval  Chest pain In the setting of recent anterior STEMI in August 2016. Cycle troponins. EKG does not show concerning ST changes. PPI, GI cocktail, continue home meds for recent ischemic cardiomyopathy including: Coreg, lisinopril, Brilinta, aspirin  Possible seizure Nursing home RN describes what she thought was seizure-like activity. Sister states he has a history of epilepsy. Not on antiepileptic medications. Neurology consulted-appreciate Dr. Cheral Markeramillo's recommendations. Neuro checks, Ativan when necessary seizure, EEG ordered  Prostatitis Being treated with Zosyn. Will continue. [facility] Current UA shows hematuria but no frank infection.  Recent community acquired pneumonia Chest x-ray is improved compared to 5 days ago but still shows left lower lobe pneumonia. Will  continue Zosyn Have asked for speech evaluation.     Consultants Called:    Neurology  Family Communication:     Spent 30+ minutes speaking with patient's brother Jonny RuizJohn and then patient's sister Corrie DandyMary.  Code Status:    Full code  Condition:  Guarded  Potential Disposition:   To nursing facility when appropriate.  Time spent in minutes : [address]   [facility] [place],  [place] on 10/08/2015 at 3:03 PM Between 7am to 7pm - Pager - [phone] After 7pm go to [email or web address] - password [email or web address] And look for the night coverage person covering me after hours

## 2015-10-08 NOTE — Progress Notes (Signed)
Attempted EEG at bedside. Pt left for CT scan then will be admitted to the floor. Pt unavailable at this time

## 2015-10-08 NOTE — ED Notes (Signed)
Attempted report x 1.  Pt will be switching rooms, new RN needs notification.

## 2015-10-08 NOTE — Consult Note (Signed)
NEURO HOSPITALIST CONSULT NOTE   Referring physician: Dr Curly Shores Reason for Consult: altered mental status  HPI:                                                                                                                                          Justin Figueroa is an 81 y.o. male wit ha past medical history significant for HLD, CAD, ischemic cardiomyopathy, transferred to [facility] for further evaluation and management of altered mental status. Patient reportedly has a history of epilepsy but no taking any AED. Patient is awake but very confused and thus can not contribute to his clinical information. Patient present from [place]  on 11/4 due to UTI, prostatitis, and possible pneumonia, currently treated with zosyn. Today, he was reportedly found in a fetal position, poorly responsive, making a grunting noise, and has been altered ever since. CT head pending. Available serologies were reviewed: wbc 13.5, unrevealing metabolic panel.   Past Medical History  Diagnosis Date  . CAD (coronary artery disease)     a. 07/2015 Ant STEMI/PCI: LM nl, LAD 70/100 (3.0x28 Promus DES), LCX 38m, RCA 50p. EF 35%.  . Ischemic cardiomyopathy     a. 07/2015 Echo: EF 25%, diff HK, mildly dil RV w/ mildly reduced fxn, mildly dil LA.  Marland Kitchen Hyperlipidemia   . Altered mental status     Past Surgical History  Procedure Laterality Date  . Cardiac catheterization N/A 07/19/2015    Procedure: Left Heart Cath and Coronary Angiography;  Surgeon: Sherren Mocha, MD;  Location: [place] [facility];  Service: Cardiovascular;  Laterality: N/A;    Family History  Problem Relation Age of Onset  . Heart disease Mother     Family History: unable to obtain due to mental status   Social History:  reports that he has never smoked. He has never used smokeless tobacco. He reports that he does not drink alcohol or use illicit drugs.  No Known Allergies  MEDICATIONS:                                                                                                                      Scheduled:    ROS: unable to obtain due to mental status  History obtained from chart review    Physical exam:  Constitutional: well developed, pleasant male in no apparent distress. Blood pressure 137/86, pulse 91, temperature 98.7 F (37.1 C), temperature source Oral, resp. rate 20, height $RemoveBe'5\' 7"'UoNfPnPSo$  (1.702 m), weight 63.504 kg (140 lb), SpO2 98 %. Eyes: no jaundice or exophthalmos.  Head: normocephalic. Neck: supple, no bruits, no JVD. Cardiac: no murmurs. Lungs: clear. Abdomen: soft, no tender, no mass. Extremities: no edema, clubbing, or cyanosis.  Skin: no rash  Neurological examination: Mental Status: Alert and awake, confused, conversation makes no sense, able to follow simple commands. Cranial Nerves: II: Discs were not assessed, right pupil 6 mm non reactive, left pupil 2 mm and reactive. No gaze preference.  III,IV, VI: ptosis not present, extra-ocular motions intact bilaterally V,VII: smile asymmetric but patient has no teeth, facial light touch sensation unreliable to test VIII: hearing normal bilaterally IX,X: uvula rises symmetrically XI: bilateral shoulder shrug XII: midline tongue extension without atrophy or fasciculations  Motor: Moves all limbs symmetrically Tone and bulk:normal tone throughout; no atrophy noted Sensory: reacts to noxious stimulation Deep Tendon Reflexes:  1 all over Plantars: Right: downgoing   Left: downgoing Cerebellar: Unable to test due to confusion Gait:  Unable to test due to confusion  Lab Results  Component Value Date/Time   CHOL 153 07/20/2015 03:09 AM    Results for orders placed or performed during the hospital encounter of 10/08/15 (from the past 48 hour(s))  Urinalysis, Routine w reflex microscopic     Status: Abnormal   Collection  Time: 10/08/15 11:40 AM  Result Value Ref Range   Color, Urine YELLOW YELLOW   APPearance TURBID (A) CLEAR   Specific Gravity, Urine 1.013 1.005 - 1.030   pH 8.5 (H) 5.0 - 8.0   Glucose, UA NEGATIVE NEGATIVE mg/dL   Hgb urine dipstick LARGE (A) NEGATIVE   Bilirubin Urine NEGATIVE NEGATIVE   Ketones, ur 40 (A) NEGATIVE mg/dL   Protein, ur NEGATIVE NEGATIVE mg/dL   Urobilinogen, UA 1.0 0.0 - 1.0 mg/dL   Nitrite NEGATIVE NEGATIVE   Leukocytes, UA NEGATIVE NEGATIVE  Urine microscopic-add on     Status: Abnormal   Collection Time: 10/08/15 11:40 AM  Result Value Ref Range   WBC, UA 3-6 <3 WBC/hpf   RBC / HPF TOO NUMEROUS TO COUNT <3 RBC/hpf   Bacteria, UA FEW (A) RARE  Comprehensive metabolic panel     Status: Abnormal   Collection Time: 10/08/15 11:54 AM  Result Value Ref Range   Sodium 139 135 - 145 mmol/L   Potassium 4.3 3.5 - 5.1 mmol/L   Chloride 104 101 - 111 mmol/L   CO2 28 22 - 32 mmol/L   Glucose, Bld 85 65 - 99 mg/dL   BUN <5 (L) 6 - 20 mg/dL   Creatinine, Ser 0.70 0.61 - 1.24 mg/dL   Calcium 7.5 (L) 8.9 - 10.3 mg/dL   Total Protein 5.3 (L) 6.5 - 8.1 g/dL   Albumin 2.1 (L) 3.5 - 5.0 g/dL   AST 58 (H) 15 - 41 U/L   ALT 27 17 - 63 U/L   Alkaline Phosphatase 52 38 - 126 U/L   Total Bilirubin 1.4 (H) 0.3 - 1.2 mg/dL   GFR calc non Af Amer >60 >60 mL/min   GFR calc Af Amer >60 >60 mL/min    Comment: (NOTE) The eGFR has been calculated using the CKD EPI equation. This calculation has not been validated in all clinical situations. eGFR's persistently <60  mL/min signify possible Chronic Kidney Disease.    Anion gap 7 5 - 15  Lipase, blood     Status: Abnormal   Collection Time: 10/08/15 11:54 AM  Result Value Ref Range   Lipase 78 (H) 11 - 51 U/L  CBC with Differential     Status: Abnormal   Collection Time: 10/08/15 12:00 PM  Result Value Ref Range   WBC 13.5 (H) 4.0 - 10.5 K/uL   RBC 4.63 4.22 - 5.81 MIL/uL   Hemoglobin 13.3 13.0 - 17.0 g/dL   HCT 40.6 39.0 -  52.0 %   MCV 87.7 78.0 - 100.0 fL   MCH 28.7 26.0 - 34.0 pg   MCHC 32.8 30.0 - 36.0 g/dL   RDW 16.4 (H) 11.5 - 15.5 %   Platelets 381 150 - 400 K/uL   Neutrophils Relative % 86 %   Lymphocytes Relative 6 %   Monocytes Relative 4 %   Eosinophils Relative 4 %   Basophils Relative 0 %   Neutro Abs 11.7 (H) 1.7 - 7.7 K/uL   Lymphs Abs 0.8 0.7 - 4.0 K/uL   Monocytes Absolute 0.5 0.1 - 1.0 K/uL   Eosinophils Absolute 0.5 0.0 - 0.7 K/uL   Basophils Absolute 0.0 0.0 - 0.1 K/uL   RBC Morphology ELLIPTOCYTES     Comment: POLYCHROMASIA PRESENT BURR CELLS    WBC Morphology TOXIC GRANULATION     Dg Chest 2 View  10/08/2015  CLINICAL DATA:  Two week history of cough EXAM: CHEST  2 VIEW COMPARISON:  October 03, 2015 FINDINGS: There is persistent airspace consolidation in the right lower lobe, slightly less compared to study from 5 days previously. There are persistent rather minimal pleural effusions bilaterally. There is atelectatic change in the left base which is stable. Heart is enlarged with pulmonary vascular within normal limits. There is a sizable paraesophageal type hernia. No adenopathy. Bones are somewhat osteoporotic. There is superior migration of both humeral heads consistent with chronic rotator cuff tears. IMPRESSION: Persistent right lower lobe airspace consolidation with pole minimally less opacity compared to 5 days previously. Atelectatic change left base. Minimal pleural effusions bilaterally. Stable cardiac prominence. Sizable paraesophageal hernia, stable. Evidence of chronic rotator cuff tears bilaterally. Electronically Signed   By: Lowella Grip III M.D.   On: 10/08/2015 12:45   Assessment/Plan: 81 y/o with altered mental status but a neuro-exam that appears to be non focal. Head CT pending, serologies significant for elevated white count without a fever but on zosyn for UTI/prostatitis and possible pneumonia. Patient reportedly has a history of epilepsy but no treated with  AED at this time. As per description, can not ruled out the possibility of a seizure. Agree with EEG. May eventually need MRI brain if CT negative. Will load with 1 gram IV keppra now and continue 500 mg BID starting tomorrow. Will follow up.  Dorian Pod, MD 10/08/2015, 3:04 PM

## 2015-10-08 NOTE — ED Notes (Signed)
Neurology at bedside.

## 2015-10-08 NOTE — ED Provider Notes (Signed)
CSN: [id number]     Arrival date & time 10/08/15  1053 History   First MD Initiated Contact with Patient 10/08/15 1055     Chief Complaint  Patient presents with  . Abdominal Pain  . Recurrent UTI     (Consider location/radiation/quality/duration/timing/severity/associated sxs/prior Treatment) The history is provided by the patient and medical records. No language interpreter was used.    Justin Figueroa is a 81 y.o. male  with a PMH of CAD, HLD presents from [place] [facility] nursing facility to the Emergency Department with the complaint of change in mental status and lower abdominal pain. He is currently being treated with zosyn for UTI and PNA per paperwork received from facility.   Level 5 caveat applies due to condition of patient, change in mental status  Past Medical History  Diagnosis Date  . CAD (coronary artery disease)     a. 07/2015 Ant STEMI/PCI: LM nl, LAD 70/100 (3.0x28 Promus DES), LCX 79m, RCA 50p. EF 35%.  . Ischemic cardiomyopathy     a. 07/2015 Echo: EF 25%, diff HK, mildly dil RV w/ mildly reduced fxn, mildly dil LA.  Marland Kitchen Hyperlipidemia   . Altered mental status    Past Surgical History  Procedure Laterality Date  . Cardiac catheterization N/A 07/19/2015    Procedure: Left Heart Cath and Coronary Angiography;  Surgeon: Tonny Bollman, MD;  Location: [facility] INVASIVE CV LAB;  Service: Cardiovascular;  Laterality: N/A;   Family History  Problem Relation Age of Onset  . Heart disease Mother    Social History  Substance Use Topics  . Smoking status: Never Smoker   . Smokeless tobacco: Never Used  . Alcohol Use: No    Review of Systems  Unable to perform ROS: Other (ROS limited by condition of patient and mental status changes)  Constitutional: Negative.   HENT: Positive for mouth sores and sore throat. Negative for congestion and rhinorrhea.   Eyes: Negative for visual disturbance.  Respiratory: Positive for cough. Negative for shortness of breath.   Cardiovascular:  Negative.   Gastrointestinal: Positive for abdominal pain.  Endocrine: Negative for polyuria.  Genitourinary: Negative for difficulty urinating.  Musculoskeletal: Negative for myalgias, back pain, arthralgias and neck pain.  Skin: Negative for rash.  Neurological: Negative for dizziness, weakness and headaches.      Allergies  Review of patient's allergies indicates no known allergies.  Home Medications   Prior to Admission medications   Medication Sig Start Date End Date Taking? Authorizing Provider  acetaminophen (TYLENOL) 500 MG tablet Take 500 mg by mouth every 6 (six) hours as needed for mild pain.   Yes Historical Provider, MD  ALPRAZolam Prudy Feeler) 0.5 MG tablet Take 0.5 mg by mouth every 6 (six) hours as needed for anxiety.   Yes Historical Provider, MD  aspirin EC 81 MG EC tablet Take 1 tablet (81 mg total) by mouth daily. 07/23/15  Yes Brittainy Sherlynn Carbon, PA-C  atorvastatin (LIPITOR) 80 MG tablet Take 80 mg by mouth daily.   Yes Historical Provider, MD  calcium-vitamin D (OSCAL WITH D) 500-200 MG-UNIT tablet Take 1 tablet by mouth 2 (two) times daily.   Yes Historical Provider, MD  carvedilol (COREG) 3.125 MG tablet Take 1 tablet (3.125 mg total) by mouth 2 (two) times daily with a meal. 07/23/15  Yes Brittainy Sherlynn Carbon, PA-C  cholecalciferol (VITAMIN D) 1000 UNITS tablet Take 1,000 Units by mouth daily.   Yes Historical Provider, MD  lisinopril (PRINIVIL,ZESTRIL) 2.5 MG tablet Take 1 tablet (2.5  mg total) by mouth every evening. 09/03/15  Yes Brittainy Sherlynn CarbonM Simmons, PA-C  Multiple Vitamins-Minerals (MULTIVITAMIN WITH MINERALS) tablet Take 1 tablet by mouth daily.   Yes Historical Provider, MD  potassium chloride SA (K-DUR,KLOR-CON) 20 MEQ tablet Take 40 mEq by mouth 2 (two) times daily. For 7 days. Started on 10-05-15   Yes Historical Provider, MD  ticagrelor (BRILINTA) 90 MG TABS tablet Take 1 tablet (90 mg total) by mouth 2 (two) times daily. 07/23/15  Yes Brittainy Sherlynn CarbonM Simmons, PA-C   nitroGLYCERIN (NITROSTAT) 0.4 MG SL tablet Place 1 tablet (0.4 mg total) under the tongue every 5 (five) minutes x 3 doses as needed for chest pain. 07/23/15   Brittainy Sherlynn CarbonM Simmons, PA-C  spironolactone (ALDACTONE) 25 MG tablet Take 0.5 tablets (12.5 mg total) by mouth daily. 07/23/15   Brittainy M Simmons, PA-C   BP 137/86 mmHg  Pulse 91  Temp(Src) 98.7 F (37.1 C) (Oral)  Resp 20  Ht 5\' 7"  (1.702 m)  Wt 140 lb (63.504 kg)  BMI 21.92 kg/m2  SpO2 98% Physical Exam  Constitutional: He is oriented to person, place, and time. He appears well-developed and well-nourished.  Alert and in no acute distress  HENT:  Head: Normocephalic and atraumatic.  Multiple white plaques of tongue and gums Right oral swelling near lesions.   Cardiovascular: Normal rate, regular rhythm, normal heart sounds and intact distal pulses.  Exam reveals no gallop and no friction rub.   No murmur heard. Pulmonary/Chest: Effort normal. No respiratory distress. He exhibits no tenderness.  Diminished breath sounds bilaterally; crackles R   Abdominal: He exhibits no mass.  Generalized abdominal tenderness. No rebound, no guarding. Abdomen soft, non-distended Bowel sounds positive in all four quadrants   Musculoskeletal: He exhibits no edema.  Lymphadenopathy:    He has no cervical adenopathy.  Neurological: He is alert and oriented to person, place, and time.  Speech is unclear and difficult to understand, but patient is able to follow commands.   Cranial Nerves:  III, IV, VI: EOM intact bilaterally V,VII:  eyes kept closed tightly against resistance, facial light touch sensation equal VIII: Very hard of hearing  IX, X: symmetric soft palate movement, uvula elevates symmetrically  XI: bilateral shoulder shrug symmetric and strong XII: midline tongue extension  5/5 muscle strength in upper and lower extremities bilaterally including strong and equal grip strength and dorsiflexion/plantar flexion Sensory to  light touch normal in all four extremities.     Skin: Skin is warm and dry. No rash noted.  Psychiatric: He has a normal mood and affect. His behavior is normal. Judgment and thought content normal.  Nursing note and vitals reviewed.   ED Course  Procedures (including critical care time) Labs Review Labs Reviewed  URINALYSIS, ROUTINE W REFLEX MICROSCOPIC (NOT AT [facility]) - Abnormal; Notable for the following:    APPearance TURBID (*)    pH 8.5 (*)    Hgb urine dipstick LARGE (*)    Ketones, ur 40 (*)    All other components within normal limits  COMPREHENSIVE METABOLIC PANEL - Abnormal; Notable for the following:    BUN <5 (*)    Calcium 7.5 (*)    Total Protein 5.3 (*)    Albumin 2.1 (*)    AST 58 (*)    Total Bilirubin 1.4 (*)    All other components within normal limits  LIPASE, BLOOD - Abnormal; Notable for the following:    Lipase 78 (*)    All other components  within normal limits  CBC WITH DIFFERENTIAL/PLATELET - Abnormal; Notable for the following:    WBC 13.5 (*)    RDW 16.4 (*)    Neutro Abs 11.7 (*)    All other components within normal limits  URINE MICROSCOPIC-ADD ON - Abnormal; Notable for the following:    Bacteria, UA FEW (*)    All other components within normal limits  AMMONIA  TSH  RPR  VITAMIN B12  URINE RAPID DRUG SCREEN, HOSP PERFORMED  HIV ANTIBODY (ROUTINE TESTING)  TROPONIN I    Imaging Review Dg Chest 2 View  10/08/2015  CLINICAL DATA:  Two week history of cough EXAM: CHEST  2 VIEW COMPARISON:  October 03, 2015 FINDINGS: There is persistent airspace consolidation in the right lower lobe, slightly less compared to study from 5 days previously. There are persistent rather minimal pleural effusions bilaterally. There is atelectatic change in the left base which is stable. Heart is enlarged with pulmonary vascular within normal limits. There is a sizable paraesophageal type hernia. No adenopathy. Bones are somewhat osteoporotic. There is superior  migration of both humeral heads consistent with chronic rotator cuff tears. IMPRESSION: Persistent right lower lobe airspace consolidation with pole minimally less opacity compared to 5 days previously. Atelectatic change left base. Minimal pleural effusions bilaterally. Stable cardiac prominence. Sizable paraesophageal hernia, stable. Evidence of chronic rotator cuff tears bilaterally. Electronically Signed   By: Bretta Bang III M.D.   On: 10/08/2015 12:45   I have personally reviewed and evaluated these images and lab results as part of my medical decision-making.   EKG Interpretation None      MDM   Final diagnoses:  Cough   Justin Figueroa presents from [facility] for cough, change in mental status. Currently being treated for UTI with IV zosyn. Patient is alert, oriented, and able to follow commands, but thoughts seem to ramble and patient is very difficult to understand.   Labs: White count elevated 13.5,  CMP reassuring (abnlities - ca 7.5, protein 5.3, albumin 2.1, bili 1.4, AST 58, BUN <5), lipase mildly elevated 78 but if pancreatitis, I would expect lipase to be higher. UA shows pH of 8.5 with few bacteria being treated for UTI   CXR: persistent RLL consolidation (less opacity compared to CXR 5 days prior), minimal pleural effusions bilaterally.   Due to change in mental status and persistent PNA with symptoms, will admit to internal medicine unassigned.   Patient seen by and discussed with Dr. Rubin Payor who agrees with treatment plan.      [facility] Ward, PA-C 10/08/15 1506  Benjiman Core, MD 10/11/15 1500

## 2015-10-08 NOTE — ED Notes (Signed)
EKG completed given to EDP.  

## 2015-10-08 NOTE — ED Notes (Signed)
Provider at bedside

## 2015-10-08 NOTE — ED Notes (Signed)
Faxed release of information to [facility] 

## 2015-10-08 NOTE — ED Notes (Signed)
[facility]

## 2015-10-08 NOTE — Progress Notes (Signed)
ANTIBIOTIC CONSULT NOTE - INITIAL  Pharmacy Consult for Zosyn Indication: pneumonia and prostatitis  No Known Allergies  Patient Measurements: Height: 5\' 7"  (170.2 cm) Weight: 140 lb (63.504 kg) IBW/kg (Calculated) : 66.1 Adjusted Body Weight:   Vital Signs: Temp: 98.7 F (37.1 C) (11/07 1107) Temp Source: Oral (11/07 1107) BP: 137/86 mmHg (11/07 1300) Pulse Rate: 91 (11/07 1300) Intake/Output from previous day:   Intake/Output from this shift:    Labs:  Recent Labs  10/08/15 1154 10/08/15 1200  WBC  --  13.5*  HGB  --  13.3  PLT  --  381  CREATININE 0.70  --    Estimated Creatinine Clearance: 63.9 mL/min (by C-G formula based on Cr of 0.7). No results for input(s): VANCOTROUGH, VANCOPEAK, VANCORANDOM, GENTTROUGH, GENTPEAK, GENTRANDOM, TOBRATROUGH, TOBRAPEAK, TOBRARND, AMIKACINPEAK, AMIKACINTROU, AMIKACIN in the last 72 hours.   Microbiology: No results found for this or any previous visit (from the past 720 hour(s)).  Medical History: Past Medical History  Diagnosis Date  . CAD (coronary artery disease)     a. 07/2015 Ant STEMI/PCI: LM nl, LAD 70/100 (3.0x28 Promus DES), LCX 7569m, RCA 50p. EF 35%.  . Ischemic cardiomyopathy     a. 07/2015 Echo: EF 25%, diff HK, mildly dil RV w/ mildly reduced fxn, mildly dil LA.  Marland Kitchen. Hyperlipidemia   . Altered mental status     Medications:   (Not in a hospital admission) Scheduled:   Infusions:  . piperacillin-tazobactam     Assessment: 81yo male with history of CAD, HLD and seizure d/o [facility] and lower abdominal pain. Pharmacy is consulted to dose zosyn for prostatitis and suspected pneumonia. Pt is afebrile, WBC 13.5, sCr 0.7.  Pt was started on zosyn on 11/4 for a 7 day course for prostatitis. Today is antibiotic day 4.  Goal of Therapy:  Eradication of infection  Plan:  Zosyn 3.375g IV q8h Expected duration 7 days with resolution of temperature and/or normalization of WBC Follow up  culture results, renal function and clinical course  Arlean HoppingCorey M. Newman PiesBall, PharmD Clinical Pharmacist Pager [phone] 10/08/2015,3:30 PM

## 2015-10-09 ENCOUNTER — Inpatient Hospital Stay (HOSPITAL_COMMUNITY): Payer: Medicare Other

## 2015-10-09 DIAGNOSIS — B370 Candidal stomatitis: Secondary | ICD-10-CM

## 2015-10-09 DIAGNOSIS — I2102 ST elevation (STEMI) myocardial infarction involving left anterior descending coronary artery: Secondary | ICD-10-CM

## 2015-10-09 LAB — CBC
HCT: 40.8 % (ref 39.0–52.0)
Hemoglobin: 13.4 g/dL (ref 13.0–17.0)
MCH: 28.9 pg (ref 26.0–34.0)
MCHC: 32.8 g/dL (ref 30.0–36.0)
MCV: 88.1 fL (ref 78.0–100.0)
Platelets: 367 10*3/uL (ref 150–400)
RBC: 4.63 MIL/uL (ref 4.22–5.81)
RDW: 16.5 % — AB (ref 11.5–15.5)
WBC: 12.4 10*3/uL — ABNORMAL HIGH (ref 4.0–10.5)

## 2015-10-09 LAB — COMPREHENSIVE METABOLIC PANEL
ALBUMIN: 2 g/dL — AB (ref 3.5–5.0)
ALK PHOS: 59 U/L (ref 38–126)
ALT: 30 U/L (ref 17–63)
AST: 72 U/L — AB (ref 15–41)
Anion gap: 9 (ref 5–15)
BILIRUBIN TOTAL: 1.4 mg/dL — AB (ref 0.3–1.2)
CALCIUM: 7.6 mg/dL — AB (ref 8.9–10.3)
CO2: 26 mmol/L (ref 22–32)
Chloride: 103 mmol/L (ref 101–111)
Creatinine, Ser: 0.72 mg/dL (ref 0.61–1.24)
GFR calc Af Amer: >60 mL/min (ref >60)
GFR calc non Af Amer: >60 mL/min (ref >60)
GLUCOSE: 72 mg/dL (ref 65–99)
Potassium: 4 mmol/L (ref 3.5–5.1)
SODIUM: 138 mmol/L (ref 135–145)
TOTAL PROTEIN: 5.3 g/dL — AB (ref 6.5–8.1)

## 2015-10-09 LAB — RPR: RPR: REACTIVE — AB

## 2015-10-09 LAB — TROPONIN I
TROPONIN I: 0.06 ng/mL — AB (ref <0.031)
TROPONIN I: 0.06 ng/mL — AB (ref <0.031)

## 2015-10-09 LAB — HIV ANTIBODY (ROUTINE TESTING W REFLEX): HIV Screen 4th Generation wRfx: NONREACTIVE

## 2015-10-09 LAB — TSH: TSH: 2.995 u[IU]/mL (ref 0.350–4.500)

## 2015-10-09 LAB — RPR, QUANT+TP ABS (REFLEX): T Pallidum Abs: POSITIVE — AB

## 2015-10-09 MED ORDER — CHLORHEXIDINE GLUCONATE 0.12 % MT SOLN
15.0000 mL | Freq: Two times a day (BID) | OROMUCOSAL | Status: DC
  Administered 2015-10-09 – 2015-10-12 (×7): 15 mL via OROMUCOSAL
  Filled 2015-10-09 (×8): qty 15

## 2015-10-09 MED ORDER — CETYLPYRIDINIUM CHLORIDE 0.05 % MT LIQD
7.0000 mL | Freq: Two times a day (BID) | OROMUCOSAL | Status: DC
  Administered 2015-10-10 – 2015-10-12 (×5): 7 mL via OROMUCOSAL

## 2015-10-09 MED ORDER — ALPRAZOLAM 0.5 MG PO TABS
0.5000 mg | ORAL_TABLET | Freq: Three times a day (TID) | ORAL | Status: DC | PRN
  Administered 2015-10-09 – 2015-10-11 (×3): 0.5 mg via ORAL
  Filled 2015-10-09 (×3): qty 1

## 2015-10-09 MED ORDER — QUETIAPINE FUMARATE 25 MG PO TABS
25.0000 mg | ORAL_TABLET | Freq: Every day | ORAL | Status: DC
  Administered 2015-10-09 – 2015-10-12 (×4): 25 mg via ORAL
  Filled 2015-10-09 (×4): qty 1

## 2015-10-09 MED ORDER — NYSTATIN 100000 UNIT/ML MT SUSP
5.0000 mL | Freq: Four times a day (QID) | OROMUCOSAL | Status: DC
  Administered 2015-10-09 – 2015-10-12 (×13): 500000 [IU] via OROMUCOSAL
  Filled 2015-10-09 (×15): qty 5

## 2015-10-09 NOTE — Progress Notes (Signed)
EEG Completed; Results Pending  

## 2015-10-09 NOTE — Progress Notes (Signed)
Speech Pathology   MBSS complete. Full report located under chart review in imaging section. Double click on DG swallow function. Recommend: Dys 2, thin liquids, no straws, pills whole in applesauce. ST will follow.    Breck CoonsLisa Willis Mendota HeightsLitaker M.Ed [employer]-SLP Pager [phone]

## 2015-10-09 NOTE — Progress Notes (Signed)
PT Cancellation Note  Patient Details Name: Justin Figueroa MRN: [id number] DOB: 01/12/1931   Cancelled Treatment:    Reason Eval/Treat Not Completed: Patient not medically ready Per order, start PT evaluation after 14:59 pm. WIll follow up as time allows.   Blake DivineShauna A Jeanluc Wegman 10/09/2015, 9:12 AM Mylo RedShauna Mylea Roarty, PT, DPT [phone]

## 2015-10-09 NOTE — Clinical Social Work Note (Cosign Needed)
Clinical Social Work Assessment  Patient Details  Name: Justin Figueroa MRN: [id number] Date of Birth: June 26, 1931  Date of referral:  10/09/15               Reason for consult:  Facility Placement                Permission sought to share information with:  Facility Sport and exercise psychologist, Family Supports Permission granted to share information::  Yes, Verbal Permission Granted  Name::     Justin Figueroa, Justin Figueroa  Agency::  SNF  Relationship::  Sister and daughters  Contact Information:  Justin Figueroa [phone])  Housing/Transportation Living arrangements for the past 2 months:  [place], [place] of Information:  Patient, Other (Comment Required) (Sister) Patient Interpreter Needed:  None Criminal Activity/Legal Involvement Pertinent to Current Situation/Hospitalization:  No - Comment as needed Significant Relationships:  Adult Children, Siblings Lives with:  Self Do you feel safe going back to the place where you live?  Yes Need for family participation in patient care:  Yes (Comment)  Care giving concerns:  Prior to hospitalization and nursing home admission, patient lived at home alone.    Social Worker assessment / plan: BSW intern met with patient at bedside to complete assessment. Patient was disoriented to situation and patient's speech was very slurred. BSW intern asked patient if it was all right to contact a family member and patient agreed. BSW intern spoke with patient sister Justin Figueroa [phone]) about patient situation. Patient sister stated that patient had lived at home prior to becoming sick. Patient sister stated that patient had gotten sick and had been hospitalized a few weeks back. Patient sister stated that patient was then sent to [facility] in [place]. Patient then came to [facility] Patient sister stated that patient had two daughters with patient ex-wife. Patient daughters are  Justin Figueroa (who lives is [place]) and Justin Figueroa (who lives in [place]). Patient sister stated that patient should have a notebook in room containing daughters contact information. Patient sister stated that it was okay to send patient information to [facility]. Patient sister said if we needed anything to let her know. Social worker will continue to follow and assess as needed.   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  Patient/Family's Understanding of and Emotional Response to Diagnosis, Current Treatment, and Prognosis:  Patient's sister expressed concern for the patient and was hoping that patient was doing better. Patient sister understood why the patient was in the hospital, but did not have complete understanding of the SNF process. Patient sister understands that patient will need rehab at discharge.   Emotional Assessment Appearance:  Appears stated age Attitude/Demeanor/Rapport:   (Appropriate) Affect (typically observed):  Unable to Assess, Appropriate Orientation:    Alcohol / Substance use:  Not Applicable Psych involvement (Current and /or in the community):  No (Comment)  Discharge Needs  Concerns to be addressed:  Discharge Planning Concerns Readmission within the last 30 days:  No Current discharge risk:  Lives alone Barriers to Discharge:  Continued Medical Work up    [employer], [id number]

## 2015-10-09 NOTE — Progress Notes (Addendum)
Patient lying in bed, no distress or pain, sitter present. Call light within reach. Called central telemetry at 21:16 to have patient put back on telemetry.  Not sure who took him off after he left the floor for Radiology.

## 2015-10-09 NOTE — Evaluation (Signed)
Physical Therapy Evaluation Patient Details Name: Justin Figueroa MRN: [id number] DOB: [birth year] Today's Date: 10/09/2015   History of Present Illness  Patient is a 81 y/o male presents with hx of ischemic cardiopathy myopathy, CAD, STEMI, [facility] with CAP and prostatitis presents with acute encephalopathy and CP from a nursing facility. Admitted for acute encephalopathy/delirium, suspected seizure activity. CXR-Minimal pleural effusions bilaterally.   Clinical Impression  Patient presents with weakness and balance deficits impacting mobility.Tolerated transfers and gait training today with Min-Mod A for balance/safety. Cooperative and following commands. Speech is unintelligible at times. Difficult to obtain accurate PLOF/history due to above. Per MD notes, pt recently d/ced [facility] SNF [facility]. Would benefit from return to ST SNF to maximize independence and mobility prior to return home.     Follow Up Recommendations SNF    Equipment Recommendations  None recommended by PT    Recommendations for Other Services       Precautions / Restrictions Precautions Precautions: Fall Restrictions Weight Bearing Restrictions: No      Mobility  Bed Mobility Overal bed mobility: Needs Assistance Bed Mobility: Supine to Sit     Supine to sit: Supervision;HOB elevated     General bed mobility comments: Use of rails for support.   Transfers Overall transfer level: Needs assistance Equipment used: Rolling walker (2 wheeled) Transfers: Sit to/from Stand Sit to Stand: Min assist         General transfer comment: Min A to boost from EOB x2 with cues for hand placement. Unsteady in standing.  Ambulation/Gait Ambulation/Gait assistance: Min assist Ambulation Distance (Feet): 24 Feet Assistive device: Rolling walker (2 wheeled) Gait Pattern/deviations: Step-through pattern;Decreased stride length;Trunk flexed   Gait velocity  interpretation: <1.8 ft/sec, indicative of risk for recurrent falls General Gait Details: SLow, unsteady gait with Min A for balance due to posterior lean. Standing rest breaks. Dyspnea present. Sp02 93% on RA. HR increased to 105 bpm  Stairs            Wheelchair Mobility    Modified Rankin (Stroke Patients Only)       Balance Overall balance assessment: Needs assistance Sitting-balance support: Feet supported;No upper extremity supported;Single extremity supported Sitting balance-Leahy Scale: Fair     Standing balance support: During functional activity Standing balance-Leahy Scale: Poor Standing balance comment: Relient on RW for support and Mod A during standing rest break due to posterior lean.                              Pertinent Vitals/Pain Pain Assessment: No/denies pain    Home Living Family/patient expects to be discharged to:: Skilled nursing facility                      Prior Function Level of Independence: Needs assistance   Gait / Transfers Assistance Needed: Reports using RW PTA. per notes, [facility] to nursing facility on 11/4.     Comments: Speech is difficult to understand at times.      Hand Dominance        Extremity/Trunk Assessment   Upper Extremity Assessment: Defer to OT evaluation           Lower Extremity Assessment: Generalized weakness         Communication   Communication: HOH (dysarthria)  Cognition Arousal/Alertness: Awake/alert Behavior During Therapy: WFL for tasks assessed/performed Overall Cognitive Status: Difficult  to assess (A&O x3- able to state election is today and the candidates, reason for admission and date.)                      General Comments      Exercises        Assessment/Plan    PT Assessment Patient needs continued PT services  PT Diagnosis Difficulty walking;Generalized weakness   PT Problem List Decreased strength;Decreased  cognition;Decreased mobility;Decreased balance;Decreased safety awareness;Decreased activity tolerance  PT Treatment Interventions Balance training;Gait training;Functional mobility training;Therapeutic activities;Therapeutic exercise;Patient/family education   PT Goals (Current goals can be found in the Care Plan section) Acute Rehab PT Goals Patient Stated Goal: to get my legs moving PT Goal Formulation: With patient Time For Goal Achievement: 10/23/15 Potential to Achieve Goals: Fair    Frequency Min 2X/week   Barriers to discharge        Co-evaluation               End of Session Equipment Utilized During Treatment: Gait belt Activity Tolerance: Patient tolerated treatment well Patient left: in bed;with call bell/phone within reach;with nursing/sitter in room (sitting EOB with sitter present) Nurse Communication: Mobility status         Time: 5621-3086 PT Time Calculation (min) (ACUTE ONLY): 21 min   Charges:   PT Evaluation $Initial PT Evaluation Tier I: 1 Procedure     PT G Codes:        Justin Figueroa 10/09/2015, 3:04 PM  [name], PT, DPT [phone]

## 2015-10-09 NOTE — Progress Notes (Signed)
Patient becoming agitated.  PRN dose of Xanax given, sitter at bedside

## 2015-10-09 NOTE — NC FL2 (Signed)
[place] MEDICAID FL2 LEVEL OF CARE SCREENING TOOL     IDENTIFICATION  Patient Name: Justin Figueroa Birthdate: 1931-07-11 Sex: male Admission Date (Current Location): 10/08/2015  [place] Number: Aaron Edelman [id number] Facility and Address:  [facility] [place], [address], [place], [place] [address]      Provider Number: [id number]  Attending Physician Name and Address:  Vassie Loll, MD  Relative Name and Phone Number:  Melene Plan [phone])    Current Level of Care: Hospital Recommended Level of Care: Skilled Nursing Facility Prior Approval Number:    Date Approved/Denied:   PASRR Number:    Discharge Plan: SNF    Current Diagnoses: Patient Active Problem List   Diagnosis Date Noted  . Acute delirium 10/08/2015  . Prostatitis 10/08/2015  . Chest pain 10/08/2015  . Seizure (HCC) 10/08/2015  . Ischemic cardiomyopathy   . Hyperlipidemia   . Psychiatric illness 07/20/2015  . ST elevation (STEMI) myocardial infarction involving left anterior descending coronary artery (HCC) 07/19/2015  . Right arm pain   . STEMI (ST elevation myocardial infarction) (HCC)     Orientation ACTIVITIES/SOCIAL BLADDER RESPIRATION    Self  Active Continent Normal  BEHAVIORAL SYMPTOMS/MOOD NEUROLOGICAL BOWEL NUTRITION STATUS    Convulsions/Seizures Continent Diet (DYS 2)  PHYSICIAN VISITS COMMUNICATION OF NEEDS Height & Weight Skin    Verbally  (170.2 cm) 143 lbs. Normal          AMBULATORY STATUS RESPIRATION    Assist independent Normal      Personal Care Assistance Level of Assistance   (Limited Assistance)            Functional Limitations Info  Hearing, Speech   Hearing Info:  (Hard of Hearing) Speech Info:  (Hard to Understand Speech)       SPECIAL CARE FACTORS FREQUENCY  PT (By licensed PT), OT (By licensed OT), Speech therapy     PT Frequency: 5x/week OT Frequency: 5x/week     Speech Therapy Frequency:  5x/week     Additional Factors Info  Code Status, Allergies Code Status Info: FULL CODE Allergies Info: KNDA           Current Medications (10/09/2015): Current Facility-Administered Medications  Medication Dose Route Frequency Provider Last Rate Last Dose  . acetaminophen (TYLENOL) tablet 650 mg  650 mg Oral Q6H PRN Stephani Police, PA-C       Or  . acetaminophen (TYLENOL) suppository 650 mg  650 mg Rectal Q6H PRN Stephani Police, PA-C      . ALPRAZolam Prudy Feeler) tablet 0.5 mg  0.5 mg Oral Q6H PRN Stephani Police, PA-C   0.5 mg at 10/08/15 2151  . alum & mag hydroxide-simeth (MAALOX/MYLANTA) 200-200-20 MG/5ML suspension 30 mL  30 mL Oral Q6H PRN Stephani Police, PA-C      . antiseptic oral rinse (CPC / CETYLPYRIDINIUM CHLORIDE 0.05%) solution 7 mL  7 mL Mouth Rinse q12n4p Vassie Loll, MD      . aspirin EC tablet 81 mg  81 mg Oral Daily Stephani Police, PA-C   81 mg at 10/09/15 1123  . bisacodyl (DULCOLAX) suppository 10 mg  10 mg Rectal Daily PRN Stephani Police, PA-C      . carvedilol (COREG) tablet 3.125 mg  3.125 mg Oral BID WC Stephani Police, PA-C   3.125 mg at 10/09/15 7846  . chlorhexidine (PERIDEX) 0.12 % solution 15 mL  15 mL Mouth Rinse BID Vassie Loll, MD  15 mL at 10/09/15 1124  . enoxaparin (LOVENOX) injection 40 mg  40 mg Subcutaneous Q24H Stephani PoliceMarianne L York, PA-C   40 mg at 10/08/15 1819  . folic acid (FOLVITE) tablet 1 mg  1 mg Oral Daily Stephani PoliceMarianne L York, PA-C   1 mg at 10/09/15 1123  . haloperidol lactate (HALDOL) injection 1 mg  1 mg Intravenous Q6H PRN Stephani PoliceMarianne L York, PA-C      . levETIRAcetam (KEPPRA) 500 mg in sodium chloride 0.9 % 100 mL IVPB  500 mg Intravenous Q12H Maryann Mikhail, DO   500 mg at 10/09/15 16100611  . lisinopril (PRINIVIL,ZESTRIL) tablet 2.5 mg  2.5 mg Oral QPM Marianne L York, PA-C      . magic mouthwash  10 mL Oral QID Stephani PoliceMarianne L York, PA-C   10 mL at 10/09/15 1401  . morphine 2 MG/ML injection 2 mg  2 mg Intravenous Q3H PRN Stephani PoliceMarianne L York, PA-C       . nitroGLYCERIN (NITROSTAT) SL tablet 0.4 mg  0.4 mg Sublingual Q5 Min x 3 PRN Stephani PoliceMarianne L York, PA-C      . ondansetron (ZOFRAN) tablet 4 mg  4 mg Oral Q6H PRN Stephani PoliceMarianne L York, PA-C       Or  . ondansetron (ZOFRAN) injection 4 mg  4 mg Intravenous Q6H PRN Stephani PoliceMarianne L York, PA-C      . piperacillin-tazobactam (ZOSYN) IVPB 3.375 g  3.375 g Intravenous Q8H Marquita Palmsorey M Ball, RPH   3.375 g at 10/09/15 1400  . piperacillin-tazobactam (ZOSYN) IVPB 3.375 g  3.375 g Intravenous Once [employer] OmahaMaryann Mikhail, DO      . promethazine (PHENERGAN) tablet 12.5 mg  12.5 mg Oral Q6H PRN Stephani PoliceMarianne L York, PA-C      . senna-docusate (Senokot-S) tablet 1 tablet  1 tablet Oral QHS PRN Tora KindredMarianne L York, PA-C      . sodium chloride 0.9 % injection 3 mL  3 mL Intravenous Q12H Marianne L York, PA-C   3 mL at 10/08/15 2200  . sodium chloride 0.9 % injection 3 mL  3 mL Intravenous Q12H Stephani PoliceMarianne L York, PA-C   3 mL at 10/08/15 2152  . thiamine (VITAMIN B-1) tablet 100 mg  100 mg Oral Daily Stephani PoliceMarianne L York, PA-C   100 mg at 10/09/15 1123  . ticagrelor (BRILINTA) tablet 90 mg  90 mg Oral BID Stephani PoliceMarianne L York, PA-C   90 mg at 10/09/15 1124   Do not use this list as official medication orders. Please verify with discharge summary.  Discharge Medications:   Medication List    ASK your doctor about these medications        acetaminophen 500 MG tablet  Commonly known as:  TYLENOL  Take 500 mg by mouth every 6 (six) hours as needed for mild pain.     ALPRAZolam 0.5 MG tablet  Commonly known as:  XANAX  Take 0.5 mg by mouth every 6 (six) hours as needed for anxiety.     aspirin 81 MG EC tablet  Take 1 tablet (81 mg total) by mouth daily.     atorvastatin 80 MG tablet  Commonly known as:  LIPITOR  Take 80 mg by mouth daily.     calcium-vitamin D 500-200 MG-UNIT tablet  Commonly known as:  OSCAL WITH D  Take 1 tablet by mouth 2 (two) times daily.     carvedilol 3.125 MG tablet  Commonly known as:  COREG  Take 1 tablet (3.125  mg total) by mouth  2 (two) times daily with a meal.     cholecalciferol 1000 UNITS tablet  Commonly known as:  VITAMIN D  Take 1,000 Units by mouth daily.     lisinopril 2.5 MG tablet  Commonly known as:  PRINIVIL,ZESTRIL  Take 1 tablet (2.5 mg total) by mouth every evening.     multivitamin with minerals tablet  Take 1 tablet by mouth daily.     nitroGLYCERIN 0.4 MG SL tablet  Commonly known as:  NITROSTAT  Place 1 tablet (0.4 mg total) under the tongue every 5 (five) minutes x 3 doses as needed for chest pain.     potassium chloride SA 20 MEQ tablet  Commonly known as:  K-DUR,KLOR-CON  Take 40 mEq by mouth 2 (two) times daily. For 7 days. Started on 10-05-15     spironolactone 25 MG tablet  Commonly known as:  ALDACTONE  Take 0.5 tablets (12.5 mg total) by mouth daily.     ticagrelor 90 MG Tabs tablet  Commonly known as:  BRILINTA  Take 1 tablet (90 mg total) by mouth 2 (two) times daily.        Relevant Imaging Results:  Relevant Lab Results:  Recent Labs    Additional Information Altered Mental Status SS# [id number]   Jenita Seashore BSW Intern, [phone]

## 2015-10-09 NOTE — Progress Notes (Signed)
OT Cancellation Note  Patient Details Name: Arleta CreekBobby G Peruski MRN: [id number] DOB: Feb 13, 1931   Cancelled Treatment:    Reason Eval/Treat Not Completed:  (Order does not start until 2:59 pm this afternoon)  Earlie RavelingStraub, Keaton Stirewalt L OTR/L [phone] 10/09/2015, 8:19 AM

## 2015-10-09 NOTE — Procedures (Signed)
ELECTROENCEPHALOGRAM REPORT   Patient: Justin CreekBobby G Strojny       Room #: 1O102W32 EEG No. ID: [id number] Age: 81 y.o.        Sex: male Referring Physician: Gwenlyn Perkingmadera Report Date:  10/09/2015        Interpreting Physician: Thana FarrEYNOLDS, Ivet Guerrieri  History: Justin Figueroa is an 81 y.o. male with acute onset delirium  Medications:  Scheduled: . antiseptic oral rinse  7 mL Mouth Rinse q12n4p  . aspirin EC  81 mg Oral Daily  . carvedilol  3.125 mg Oral BID WC  . chlorhexidine  15 mL Mouth Rinse BID  . enoxaparin (LOVENOX) injection  40 mg Subcutaneous Q24H  . folic acid  1 mg Oral Daily  . levETIRAcetam  500 mg Intravenous Q12H  . lisinopril  2.5 mg Oral QPM  . magic mouthwash  10 mL Oral QID  . piperacillin-tazobactam (ZOSYN)  IV  3.375 g Intravenous Q8H  . piperacillin-tazobactam  3.375 g Intravenous Once  . sodium chloride  3 mL Intravenous Q12H  . sodium chloride  3 mL Intravenous Q12H  . thiamine  100 mg Oral Daily  . ticagrelor  90 mg Oral BID    Conditions of Recording:  This is a 16 channel EEG carried out with the patient in the awake and drowsy states.  Description:  The waking background activity consists of a low voltage, symmetrical, fairly well organized, 8 Hz alpha activity, seen from the parieto-occipital and posterior temporal regions.  It is poorly sustained and only noted infrequently during the recording.  A posterior background rhythm that consists mostly of a theta rhythm is much more commonly seen.  Low voltage fast activity, poorly organized, is seen anteriorly and is at times superimposed on more posterior regions.  A mixture of theta and alpha rhythms are seen from the central and temporal regions. The patient drowses with slowing to irregular, low voltage theta and beta activity.   Stage II sleep is not obtained. Hyperventilation and intermittent photic stimulation were not performed.   IMPRESSION: This EEG is characterized by slowing which is consistent with normal  drowse.  Can not rule out the possibility of slowing related to general cerebral disturbance such as a metabolic encephalopathy.  Clinical correlation recommended.  No epileptiform activity is noted.       Thana FarrLeslie Rashad Auld, MD [facility] [phone] 10/09/2015, 11:00 AM

## 2015-10-09 NOTE — Evaluation (Addendum)
Clinical/Bedside Swallow Evaluation Patient Details  Name: Justin Figueroa MRN: [id number] Date of Birth: 1931/09/04  Today's Date: 10/09/2015 Time: SLP Start Time (ACUTE ONLY): 40980826 SLP Stop Time (ACUTE ONLY): 0849 SLP Time Calculation (min) (ACUTE ONLY): 23 min  Past Medical History:  Past Medical History  Diagnosis Date  . CAD (coronary artery disease)     a. 07/2015 Ant STEMI/PCI: LM nl, LAD 70/100 (3.0x28 Promus DES), LCX 8242m, RCA 50p. EF 35%.  . Ischemic cardiomyopathy     a. 07/2015 Echo: EF 25%, diff HK, mildly dil RV w/ mildly reduced fxn, mildly dil LA.  Marland Kitchen. Hyperlipidemia   . Altered mental status    Past Surgical History:  Past Surgical History  Procedure Laterality Date  . Cardiac catheterization N/A 07/19/2015    Procedure: Left Heart Cath and Coronary Angiography;  Surgeon: Tonny BollmanMichael Cooper, MD;  [facility];  Service: Cardiovascular;  Laterality: N/A;   HPI:  81 year old male with a history of ischemic cardiopathy myopathy, coronary disease, recent community-acquired pneumonia prostatitis admitted with acute encephalopathy and chest pain from a nursing facility. Per MD note patient being admitted for acute encephalopathy/delirium, suspected seizure activity. CXR persistent right lower lobe airspace consolidation with pole minimally less opacity compared to 5 days previously. CT no evidence of acute hemorrhage or infarct.   Assessment / Plan / Recommendation Clinical Impression  Dysarthric speech, lingual ulcerations circular with red in the middle- placed on high risk oral care protocol. Decreased oral cohesion with labial spill and decreased and delayed mastication with solid (endentulous). Suspect delayed swallow initiation, decreased laryngeal elevation during palpation, watery eyes and increased respiratory effort warrant full obsevation with MBS scheduled today 10:30. Meds only in applesauce (crushed) until MBS recommended.      Aspiration Risk   Moderate    Diet Recommendation NPO except meds   Medication Administration: Crushed with puree    Other  Recommendations Oral Care Recommendations: Oral care QID   Follow Up Recommendations       Frequency and Duration        Pertinent Vitals/Pain none    SLP Swallow Goals     Swallow Study Prior Functional Status       General Other Pertinent Information: 81 year old male with a history of ischemic cardiopathy myopathy, coronary disease, recent community-acquired pneumonia prostatitis admitted with acute encephalopathy and chest pain from a nursing facility. Per MD note patient being admitted for acute encephalopathy/delirium, suspected seizure activity. CXR persistent right lower lobe airspace consolidation with pole minimally less opacity compared to 5 days previously. CT no evidence of acute hemorrhage or infarct. Type of Study: Bedside swallow evaluation Previous Swallow Assessment:  (none found) Diet Prior to this Study: Regular;Thin liquids Temperature Spikes Noted: No Respiratory Status: Supplemental O2 delivered via (comment) History of Recent Intubation: No Behavior/Cognition: Alert;Cooperative;Confused;Requires cueing Oral Cavity - Dentition: Edentulous Self-Feeding Abilities: Able to feed self Patient Positioning: Upright in bed Baseline Vocal Quality: Normal Volitional Cough: Strong Volitional Swallow: Able to elicit (difficult to initiate)    Oral/Motor/Sensory Function Overall Oral Motor/Sensory Function:  (generalized weakness)   Ice Chips Ice chips: Not tested   Thin Liquid Thin Liquid: Impaired Presentation: Cup;Straw Oral Phase Impairments: Reduced labial seal;Reduced lingual movement/coordination Oral Phase Functional Implications: Right anterior spillage Pharyngeal  Phase Impairments: Suspected delayed Swallow;Decreased hyoid-laryngeal movement    Nectar Thick Nectar Thick Liquid: Not tested   Honey Thick Honey Thick Liquid: Not tested   Puree  Puree: Impaired Presentation: Self Fed Oral Phase  Impairments: Reduced lingual movement/coordination Pharyngeal Phase Impairments: Decreased hyoid-laryngeal movement;Suspected delayed Swallow   Solid   GO    Solid: Impaired Oral Phase Impairments: Reduced lingual movement/coordination;Impaired anterior to posterior transit;Impaired mastication Pharyngeal Phase Impairments: Decreased hyoid-laryngeal movement       Justin Figueroa 10/09/2015,9:06 AM   Justin Figueroa.Ed [employer] [phone]

## 2015-10-09 NOTE — Progress Notes (Addendum)
[facility] PROGRESS NOTE  KHIEM GARGIS BMW:[id number] DOB: 1976/03/24 DOA: 10/08/2015 PCP: Kirstie Peri, MD  Assessment/Plan: Acute delirium -Uncertain etiology. Differential includes recent seizure, medications, infectious and Health Care associated Delirium -CT head neg; ammonia level, TSH and B-12 WNL -RPR pending -will add low dose seroquel -continue Sitter at bedside.  -continue treatment for PNA/prostatitis  -follow clinical response   Chest pain -In the setting of recent anterior STEMI in August 2016. -EKG does not show concerning ST changes. -will continue PPI, GI cocktail, continue home meds for recent ischemic cardiomyopathy including: Coreg, lisinopril, Brilinta and aspirin -flat troponin elevation and no CP today on exam; most likely demand ischemia  Possible seizure -Nursing home RN describes what she thought was seizure-like activity. Sister states he has a history of epilepsy. Not on antiepileptic medications. -EEG w/o epileptiform activity -will follow neurology rec's -currently on keppra   Prostatitis -Being treated with Zosyn. Will continue.  -Current UA shows hematuria but no nitrite -will follow culture data.  Recent community acquired pneumonia -Chest x-ray is improved compared to 5 days ago but still shows left lower lobe pneumonia.  -Will continue Zosyn -Follow recommendations from SPL  Trush -will treat with nystatin    Code Status: Full Family Communication: no family at bedside  Disposition Plan: remains in the hospital; will order MRI w/o contrast, continue antibiotics and if delirium persist will add low dose seroquel.   Consultants:  Neurology (Dr. Leroy Kennedy)  Procedures:  EEG: characterized by slowing which is consistent with normal drowse. Can not rule out the possibility of slowing related to general cerebral disturbance such as a metabolic encephalopathy. Clinical correlation recommended. No epileptiform activity is  noted.   Antibiotics:  Zosyn 11/04  HPI/Subjective: Afebrile, no chest pain, no shortness of breath. Still with confusion, even more calm and able to follow commands.  Objective: Filed Vitals:   10/09/15 1328  BP: 117/52  Pulse: 83  Temp: 99.3 F (37.4 C)  Resp: 18    Intake/Output Summary (Last 24 hours) at 10/09/15 1610 Last data filed at 10/09/15 1500  Gross per 24 hour  Intake      0 ml  Output   3050 ml  Net  -3050 ml   Filed Weights   10/08/15 1107 10/08/15 1704 10/09/15 0422  Weight: 63.504 kg (140 lb) 64.8 kg (142 lb 13.7 oz) 65 kg (143 lb 4.8 oz)    Exam:   General:   afebrile, oriented 1; able to follow some commands intermittently. No complaining of chest pain or shortness of breath. No seizure has been appreciated while in house  Cardiovascular: S1 and S2, no rubs, no gallops   Respiratory: good air movement, no wheezing, no crackles  Abdomen: Soft, nontender, positive bowel sounds  Musculoskeletal: No edema, no cyanosis.    Data Reviewed: Basic Metabolic Panel:  Recent Labs Lab 10/08/15 1154 10/09/15 0404  NA 139 138  K 4.3 4.0  CL 104 103  CO2 28 26  GLUCOSE 85 72  BUN <5* <5*  CREATININE 0.70 0.72  CALCIUM 7.5* 7.6*   Liver Function Tests:  Recent Labs Lab 10/08/15 1154 10/09/15 0404  AST 58* 72*  ALT 27 30  ALKPHOS 52 59  BILITOT 1.4* 1.4*  PROT 5.3* 5.3*  ALBUMIN 2.1* 2.0*    Recent Labs Lab 10/08/15 1154  LIPASE 78*    Recent Labs Lab 10/08/15 1606  AMMONIA 20   CBC:  Recent Labs Lab 10/08/15 1200 10/09/15 0404  WBC 13.5* 12.4*  NEUTROABS 11.7*  --   HGB 13.3 13.4  HCT 40.6 40.8  MCV 87.7 88.1  PLT 381 367   Cardiac Enzymes:  Recent Labs Lab 10/08/15 1607 10/09/15 0404 10/09/15 0920  TROPONINI 0.06* 0.06* 0.06*   BNP (last 3 results)  Recent Labs  07/19/15 1630 07/20/15 0309  BNP 189.4* 682.1*   Studies: Dg Chest 2 View  10/08/2015  CLINICAL DATA:  Two week history of cough EXAM:  CHEST  2 VIEW COMPARISON:  October 03, 2015 FINDINGS: There is persistent airspace consolidation in the right lower lobe, slightly less compared to study from 5 days previously. There are persistent rather minimal pleural effusions bilaterally. There is atelectatic change in the left base which is stable. Heart is enlarged with pulmonary vascular within normal limits. There is a sizable paraesophageal type hernia. No adenopathy. Bones are somewhat osteoporotic. There is superior migration of both humeral heads consistent with chronic rotator cuff tears. IMPRESSION: Persistent right lower lobe airspace consolidation with pole minimally less opacity compared to 5 days previously. Atelectatic change left base. Minimal pleural effusions bilaterally. Stable cardiac prominence. Sizable paraesophageal hernia, stable. Evidence of chronic rotator cuff tears bilaterally. Electronically Signed   By: Bretta Bang III M.D.   On: 10/08/2015 12:45   Ct Head Wo Contrast  10/08/2015  CLINICAL DATA:  Acute encephalopathy, confusion EXAM: CT HEAD WITHOUT CONTRAST TECHNIQUE: Contiguous axial images were obtained from the base of the skull through the vertex without intravenous contrast. COMPARISON:  09/28/2015 FINDINGS: Moderate diffuse age-related atrophy. Mild chronic white matter disease. Tiny stable lacunar infarct left thalamus. No evidence of vascular territory infarct hemorrhage mass or extra-axial fluid. No hydrocephalus. Minimal age-related basal ganglia calcification bilaterally. Left mastoid air cells are nearly completely opacified. Partial opacification right mastoid air cells. IMPRESSION: No evidence of acute hemorrhage or infarct. New opacification left mastoid air cells, suggesting the presence of fluid which can be seen with mastoiditis. Electronically Signed   By: Esperanza Heir M.D.   On: 10/08/2015 15:36   Dg Swallowing Func-speech Pathology  10/09/2015  m Objective Swallowing Evaluation:   MBS Patient  Details Name: Justin Figueroa MRN: [id number] Date of Birth: 06-24-1976 Today's Date: 10/09/2015 Time: SLP Start Time (ACUTE ONLY): 1028-SLP Stop Time (ACUTE ONLY): 1045 SLP Time Calculation (min) (ACUTE ONLY): 17 min Past Medical History: Past Medical History Diagnosis Date . CAD (coronary artery disease)    a. 07/2015 Ant STEMI/PCI: LM nl, LAD 70/100 (3.0x28 Promus DES), LCX 53m, RCA 50p. EF 35%. . Ischemic cardiomyopathy    a. 07/2015 Echo: EF 25%, diff HK, mildly dil RV w/ mildly reduced fxn, mildly dil LA. Marland Kitchen Hyperlipidemia  . Altered mental status  Past Surgical History: Past Surgical History Procedure Laterality Date . Cardiac catheterization N/A 07/19/2015   Procedure: Left Heart Cath and Coronary Angiography;  Surgeon: Tonny Bollman, MD;  [facility] INVASIVE CV LAB;  Service: Cardiovascular;  Laterality: N/A; HPI: Other Pertinent Information: 81 year old male with a history of ischemic cardiopathy myopathy, coronary disease, recent community-acquired pneumonia prostatitis admitted with acute encephalopathy and chest pain from a nursing facility. Per MD note patient being admitted for acute encephalopathy/delirium, suspected seizure activity. CXR persistent right lower lobe airspace consolidation with pole minimally less opacity compared to 5 days previously. CT no evidence of acute hemorrhage or infarct. BSE recommended MBS. No Data Recorded Assessment / Plan / Recommendation CHL IP CLINICAL IMPRESSIONS 10/09/2015 Therapy Diagnosis Moderate oral phase dysphagia;Mild pharyngeal phase dysphagia Clinical Impression Pt exhibited delayed oral manipulation  and transit more so with regular texture and anterior spillage and anterior buccal retainment with thin barium. Mild senorimotor impairments led to delayed swallow to the pyriform sinuses and mild vallecular and pyriform residue (not sensed). Verbal and visual feedback for double swallow was effective in clearance. No penetration or aspiration observed however  mildly increased risk due to increased work of breathing during meals. Recommend Dys 2 texture and thin liquids, no straws, pills whole in applesauce and double swallow.     CHL IP TREATMENT RECOMMENDATION 10/09/2015 Treatment Recommendations Therapy as outlined in treatment plan below   CHL IP DIET RECOMMENDATION 10/09/2015 SLP Diet Recommendations Dysphagia 2 (Fine chop);Thin Liquid Administration via (None) Medication Administration Whole meds with puree Compensations Slow rate;Small sips/bites;Check for pocketing Postural Changes and/or Swallow Maneuvers (None)   CHL IP OTHER RECOMMENDATIONS 10/09/2015 Recommended Consults (None) Oral Care Recommendations Oral care QID Other Recommendations (None)   No flowsheet data found.  CHL IP FREQUENCY AND DURATION 10/09/2015 Speech Therapy Frequency (ACUTE ONLY) min 2x/week Treatment Duration 2 weeks   Pertinent Vitals/Pain  SLP Swallow Goals No flowsheet data found. No flowsheet data found.   CHL IP REASON FOR REFERRAL 10/09/2015 Reason for Referral Objectively evaluate swallowing function       No flowsheet data found.   Royce MacadamiaLitaker, [name] 10/09/2015, 11:14 AM Breck CoonsLisa Willis Lonell FaceLitaker M.Ed CCC-SLP Pager [phone]    Scheduled Meds: . antiseptic oral rinse  7 mL Mouth Rinse q12n4p  . aspirin EC  81 mg Oral Daily  . carvedilol  3.125 mg Oral BID WC  . chlorhexidine  15 mL Mouth Rinse BID  . enoxaparin (LOVENOX) injection  40 mg Subcutaneous Q24H  . folic acid  1 mg Oral Daily  . levETIRAcetam  500 mg Intravenous Q12H  . lisinopril  2.5 mg Oral QPM  . magic mouthwash  10 mL Oral QID  . piperacillin-tazobactam (ZOSYN)  IV  3.375 g Intravenous Q8H  . piperacillin-tazobactam  3.375 g Intravenous Once  . sodium chloride  3 mL Intravenous Q12H  . sodium chloride  3 mL Intravenous Q12H  . thiamine  100 mg Oral Daily  . ticagrelor  90 mg Oral BID   Continuous Infusions:   Principal Problem:   Acute delirium Active Problems:   ST elevation (STEMI) myocardial  infarction involving left anterior descending coronary artery [facility](HCC)   Psychiatric illness   Ischemic cardiomyopathy   Hyperlipidemia   Prostatitis   Chest pain   Seizure (HCC)    Time spent: 30 minutes    Vassie LollMadera, Lanyia Jewel  [facility] Pager [phone]. If 7PM-7AM, please contact night-coverage at [email or web address], password [email or web address] 10/09/2015, 4:10 PM  LOS: 1 day

## 2015-10-10 DIAGNOSIS — B9689 Other specified bacterial agents as the cause of diseases classified elsewhere: Secondary | ICD-10-CM

## 2015-10-10 DIAGNOSIS — R319 Hematuria, unspecified: Secondary | ICD-10-CM

## 2015-10-10 DIAGNOSIS — R410 Disorientation, unspecified: Secondary | ICD-10-CM

## 2015-10-10 DIAGNOSIS — G9340 Encephalopathy, unspecified: Secondary | ICD-10-CM

## 2015-10-10 DIAGNOSIS — N419 Inflammatory disease of prostate, unspecified: Secondary | ICD-10-CM

## 2015-10-10 LAB — TROPONIN I: Troponin I: 0.04 ng/mL — ABNORMAL HIGH (ref <0.031)

## 2015-10-10 MED ORDER — SODIUM CHLORIDE 0.9 % IV SOLN
500.0000 mg | Freq: Two times a day (BID) | INTRAVENOUS | Status: DC
  Administered 2015-10-10 – 2015-10-12 (×5): 500 mg via INTRAVENOUS
  Filled 2015-10-10 (×6): qty 5

## 2015-10-10 MED ORDER — GI COCKTAIL ~~LOC~~
30.0000 mL | Freq: Once | ORAL | Status: AC
  Administered 2015-10-10: 30 mL via ORAL
  Filled 2015-10-10: qty 30

## 2015-10-10 NOTE — Progress Notes (Signed)
Patient complained of new chest pain of 5/10. Vital stable, EKG done, 2 nitro SL given with some relief. Dr Merdis DelayK. Schorr notified. New orders received. Will continue to monitor patient. Tarri GlennFolashade Alabi, RN

## 2015-10-10 NOTE — Progress Notes (Signed)
Keppra that was supposed to be hung at 1600 11/08 was not run.  Spoke with Romeo AppleBen in the pharmacy who advised that there was nothing to be done at this time. Did advise that his 0400 dose was running at this time.  Will update Mar to show dose not given.  Patient not having any seizure activity.  Advised the sitter to let me know if patient shows any signs of seizure.  Patient sleeping at this time. Call light is within reach.

## 2015-10-10 NOTE — Progress Notes (Signed)
Speech Language Pathology Treatment: Dysphagia  Patient Details Name: Justin Figueroa MRN: [id number] DOB: 04-17-1931 Today's Date: 10/10/2015 Time: [id number] SLP Time Calculation (min) (ACUTE ONLY): 17 min  Assessment / Plan / Recommendation Clinical Impression  Sitter (earlier this morning) stated pt coughed several times during breakfast. No difficulties during observation of Dys 2 snack and thin liquids. Took small sips without cueing. Verbal reminders for double swallow and alternate (he appears unable to recall strategies). Continue Dys 2, thin and full supervision and continued ST.   HPI Other Pertinent Information: 81 year old male with a history of ischemic cardiopathy myopathy, coronary disease, recent community-acquired pneumonia prostatitis admitted with acute encephalopathy and chest pain from a nursing facility. Per MD note patient being admitted for acute encephalopathy/delirium, suspected seizure activity. CXR persistent right lower lobe airspace consolidation with pole minimally less opacity compared to 5 days previously. CT no evidence of acute hemorrhage or infarct. BSE recommended MBS.   Pertinent Vitals Pain Assessment: No/denies pain  SLP Plan  Continue with current plan of care    Recommendations Diet recommendations: Dysphagia 2 (fine chop);Thin liquid Liquids provided via: Cup;No straw Medication Administration: Whole meds with puree Supervision: Patient able to self feed;Full supervision/cueing for compensatory strategies Compensations: Slow rate;Small sips/bites;Check for pocketing Postural Changes and/or Swallow Maneuvers: Seated upright 90 degrees              Oral Care Recommendations: Oral care QID Follow up Recommendations: Skilled Nursing facility Plan: Continue with current plan of care         Justin Figueroa, Justin Figueroa 10/10/2015, 10:42 AM   Justin Figueroa M.Ed [employer]-SLP Pager [phone]

## 2015-10-10 NOTE — Consult Note (Signed)
[facility]       Reason for Consult: ? neurosyphilis    Referring Physician: Dr. Randol Kern  Principal Problem:   Acute delirium Active Problems:   ST elevation (STEMI) myocardial infarction involving left anterior descending coronary artery [facility])   Psychiatric illness   Ischemic cardiomyopathy   Hyperlipidemia   Prostatitis   Chest pain   Seizure (HCC)   . antiseptic oral rinse  7 mL Mouth Rinse q12n4p  . aspirin EC  81 mg Oral Daily  . carvedilol  3.125 mg Oral BID WC  . chlorhexidine  15 mL Mouth Rinse BID  . folic acid  1 mg Oral Daily  . levETIRAcetam  500 mg Intravenous Q12H  . lisinopril  2.5 mg Oral QPM  . nystatin  5 mL Mouth/Throat QID  . piperacillin-tazobactam (ZOSYN)  IV  3.375 g Intravenous Q8H  . piperacillin-tazobactam  3.375 g Intravenous Once  . QUEtiapine  25 mg Oral QHS  . sodium chloride  3 mL Intravenous Q12H  . sodium chloride  3 mL Intravenous Q12H  . thiamine  100 mg Oral Daily  . ticagrelor  90 mg Oral BID    Recommendations: I do think LP indicated for ? +VDRL Will need IM penicillin as well, will wait until after LP, will be x 3 weekly doses  PSA  Assessment: He has acute encephaolpathy and may have some element of progressive disease as well. RPR positive and he does not recall ever being tested or treated for syphilis.   Weakness as well and recommended SNF Prostatitis, unknown duration of treatment on zosyn Pneumonia, no hypoxia, no SOB, resolved hematuria  Antibiotics: Pip/tazo  HPI: Justin Figueroa is a 81 y.o. male with recent hospitalization at [facility] with pneumonia and prostatitis on zosyn who came in from SNF with AMS.  By report he has occasional episodes of acting out.  He also appeared to have had a seizure-like episode.  He was seen by cardiology 1 year ago due to dizziness and it was reported that his wife was under adult protective services and his house condemned.  He denies knowing of  any history of syphilis.   CXR independently reviewed and opacity on right, likely atelectasis on left side.   Previous echo report with low EF of 25%.   Review of Systems:  Constitutional: negative for fevers and chills Gastrointestinal: negative for diarrhea All other systems reviewed and are negative   Past Medical History  Diagnosis Date  . CAD (coronary artery disease)     a. 07/2015 Ant STEMI/PCI: LM nl, LAD 70/100 (3.0x28 Promus DES), LCX 30m, RCA 50p. EF 35%.  . Ischemic cardiomyopathy     a. 07/2015 Echo: EF 25%, diff HK, mildly dil RV w/ mildly reduced fxn, mildly dil LA.  Marland Kitchen Hyperlipidemia   . Altered mental status     Social History  Substance Use Topics  . Smoking status: Never Smoker   . Smokeless tobacco: Never Used  . Alcohol Use: No    Family History  Problem Relation Age of Onset  . Heart disease Mother     No Known Allergies  Physical Exam: Constitutional: in no apparent distress and alert  Filed Vitals:   10/10/15 1342  BP: 112/72  Pulse: 86  Temp: 98.1 F (36.7 C)  Resp: 18   EYES: anicteric ENMT: poor dentition Cardiovascular: Cor RRR and No murmurs Respiratory: clear; normal respiratory effort GI: Bowel sounds are normal, liver is not  enlarged, spleen is not enlarged Musculoskeletal: peripheral pulses normal, no pedal edema, no clubbing or cyanosis Skin: negatives: no rash Hematologic: no cervical lad  Lab Results  Component Value Date   WBC 12.4* 10/09/2015   HGB 13.4 10/09/2015   HCT 40.8 10/09/2015   MCV 88.1 10/09/2015   PLT 367 10/09/2015    Lab Results  Component Value Date   CREATININE 0.72 10/09/2015   BUN <5* 10/09/2015   NA 138 10/09/2015   K 4.0 10/09/2015   CL 103 10/09/2015   CO2 26 10/09/2015    Lab Results  Component Value Date   ALT 30 10/09/2015   AST 72* 10/09/2015   ALKPHOS 59 10/09/2015     Microbiology: No results found for this or any previous visit (from the past 240 hour(s)).  Justin Figueroa, Justin Caylor,  MD [facility] [email or web address] [phone] pager  [phone] cell 10/10/2015, 4:21 PM

## 2015-10-10 NOTE — Progress Notes (Signed)
Subjective: Very drowsy, states he does not have a history of seizure.   Objective: Current vital signs: BP 91/52 mmHg  Pulse 88  Temp(Src) 98.5 F (36.9 C) (Oral)  Resp 18  Ht  (1.702 m)  Wt 62.6 kg (138 lb 0.1 oz)  BMI 21.61 kg/m2  SpO2 93% Vital signs in last 24 hours: Temp:  [98.2 F (36.8 C)-99.3 F (37.4 C)] 98.5 F (36.9 C) (11/09 0415) Pulse Rate:  [83-88] 88 (11/09 0415) Resp:  [18] 18 (11/09 0415) BP: (91-117)/(52-66) 91/52 mmHg (11/09 0415) SpO2:  [93 %-95 %] 93 % (11/09 0415) Weight:  [62.6 kg (138 lb 0.1 oz)] 62.6 kg (138 lb 0.1 oz) (11/09 0415)  Intake/Output from previous day: 11/08 0701 - 11/09 0700 In: 240 [P.O.:240] Out: 3250 [Urine:3250] Intake/Output this shift: Total I/O In: -  Out: 125 [Urine:125] Nutritional status: DIET DYS 2 Room service appropriate?: Yes; Fluid consistency:: Thin  Neurologic Exam: Mental Status: Drowsy, minimal conversation, able to follow simple commands. Cranial Nerves: II: , right pupil 5 mm non reactive, left pupil 2 mm and reactive. No gaze preference.  III,IV, VI: ptosis not present, extra-ocular motions intact bilaterally V,VII: symmetric but patient has no teeth, facial light touch sensation unreliable to test VIII: hearing normal bilaterally IX,X: uvula rises symmetrically XI: bilateral shoulder shrug XII: midline tongue extension without atrophy or fasciculations  Motor: Moves all limbs symmetrically and antigravity Tone and bulk:normal tone throughout; no atrophy noted Sensory: reacts to noxious stimulation Deep Tendon Reflexes:  1 all over Plantars: Right: downgoingLeft: downgoing   Lab Results: Basic Metabolic Panel:  Recent Labs Lab 10/08/15 1154 10/09/15 0404  NA 139 138  K 4.3 4.0  CL 104 103  CO2 28 26  GLUCOSE 85 72  BUN <5* <5*  CREATININE 0.70 0.72  CALCIUM 7.5* 7.6*    Liver Function Tests:  Recent Labs Lab 10/08/15 1154 10/09/15 0404   AST 58* 72*  ALT 27 30  ALKPHOS 52 59  BILITOT 1.4* 1.4*  PROT 5.3* 5.3*  ALBUMIN 2.1* 2.0*    Recent Labs Lab 10/08/15 1154  LIPASE 78*    Recent Labs Lab 10/08/15 1606  AMMONIA 20    CBC:  Recent Labs Lab 10/08/15 1200 10/09/15 0404  WBC 13.5* 12.4*  NEUTROABS 11.7*  --   HGB 13.3 13.4  HCT 40.6 40.8  MCV 87.7 88.1  PLT 381 367    Cardiac Enzymes:  Recent Labs Lab 10/08/15 1607 10/09/15 0404 10/09/15 0920  TROPONINI 0.06* 0.06* 0.06*    Lipid Panel: No results for input(s): CHOL, TRIG, HDL, CHOLHDL, VLDL, LDLCALC in the last 168 hours.  CBG: No results for input(s): GLUCAP in the last 168 hours.  Microbiology: Results for orders placed or performed during the hospital encounter of 07/19/15  MRSA PCR Screening     Status: None   Collection Time: 07/19/15  4:10 PM  Result Value Ref Range Status   MRSA by PCR NEGATIVE NEGATIVE Final    Comment:        The GeneXpert MRSA Assay (FDA approved for NASAL specimens only), is one component of a comprehensive MRSA colonization surveillance program. It is not intended to diagnose MRSA infection nor to guide or monitor treatment for MRSA infections.     Coagulation Studies: No results for input(s): LABPROT, INR in the last 72 hours.  Imaging: Dg Chest 2 View  10/08/2015  CLINICAL DATA:  Two week history of cough EXAM: CHEST  2 VIEW COMPARISON:  October 03, 2015 FINDINGS: There is persistent airspace consolidation in the right lower lobe, slightly less compared to study from 5 days previously. There are persistent rather minimal pleural effusions bilaterally. There is atelectatic change in the left base which is stable. Heart is enlarged with pulmonary vascular within normal limits. There is a sizable paraesophageal type hernia. No adenopathy. Bones are somewhat osteoporotic. There is superior migration of both humeral heads consistent with chronic rotator cuff tears. IMPRESSION: Persistent right lower  lobe airspace consolidation with pole minimally less opacity compared to 5 days previously. Atelectatic change left base. Minimal pleural effusions bilaterally. Stable cardiac prominence. Sizable paraesophageal hernia, stable. Evidence of chronic rotator cuff tears bilaterally. Electronically Signed   By: Bretta BangWilliam  Woodruff III M.D.   On: 10/08/2015 12:45   Ct Head Wo Contrast  10/08/2015  CLINICAL DATA:  Acute encephalopathy, confusion EXAM: CT HEAD WITHOUT CONTRAST TECHNIQUE: Contiguous axial images were obtained from the base of the skull through the vertex without intravenous contrast. COMPARISON:  09/28/2015 FINDINGS: Moderate diffuse age-related atrophy. Mild chronic white matter disease. Tiny stable lacunar infarct left thalamus. No evidence of vascular territory infarct hemorrhage mass or extra-axial fluid. No hydrocephalus. Minimal age-related basal ganglia calcification bilaterally. Left mastoid air cells are nearly completely opacified. Partial opacification right mastoid air cells. IMPRESSION: No evidence of acute hemorrhage or infarct. New opacification left mastoid air cells, suggesting the presence of fluid which can be seen with mastoiditis. Electronically Signed   By: Esperanza Heiraymond  Rubner M.D.   On: 10/08/2015 15:36   Mr Brain Wo Contrast  10/09/2015  CLINICAL DATA:  Altered mental status EXAM: MRI HEAD WITHOUT CONTRAST TECHNIQUE: Multiplanar, multiecho pulse sequences of the brain and surrounding structures were obtained without intravenous contrast. COMPARISON:  CT head 10/08/2015 FINDINGS: Moderate generalized atrophy. Ventricular enlargement consistent with atrophy. Pituitary not enlarged. Negative for acute infarct.  No significant chronic ischemia. Negative for hemorrhage or mass.  No shift of the midline structures Mastoid sinus effusion bilaterally. Paranasal sinuses clear. Normal orbit. IMPRESSION: Generalized atrophy.  Negative for acute infarct Bilateral mastoid sinus effusion.  Electronically Signed   By: Marlan Palauharles  Clark M.D.   On: 10/09/2015 20:56   Dg Swallowing Func-speech Pathology  10/09/2015  m Objective Swallowing Evaluation:   MBS Patient Details Name: Justin Figueroa MRN: [id number] Date of Birth: January 08, 1931 Today's Date: 10/09/2015 Time: SLP Start Time (ACUTE ONLY): 1028-SLP Stop Time (ACUTE ONLY): 1045 SLP Time Calculation (min) (ACUTE ONLY): 17 min Past Medical History: Past Medical History Diagnosis Date . CAD (coronary artery disease)    a. 07/2015 Ant STEMI/PCI: LM nl, LAD 70/100 (3.0x28 Promus DES), LCX 4718m, RCA 50p. EF 35%. . Ischemic cardiomyopathy    a. 07/2015 Echo: EF 25%, diff HK, mildly dil RV w/ mildly reduced fxn, mildly dil LA. Marland Kitchen. Hyperlipidemia  . Altered mental status  Past Surgical History: Past Surgical History Procedure Laterality Date . Cardiac catheterization N/A 07/19/2015   Procedure: Left Heart Cath and Coronary Angiography;  Surgeon: Tonny BollmanMichael Cooper, MD;  [facility];  Service: Cardiovascular;  Laterality: N/A; HPI: Other Pertinent Information: 81 year old male with a history of ischemic cardiopathy myopathy, coronary disease, recent community-acquired pneumonia prostatitis admitted with acute encephalopathy and chest pain from a nursing facility. Per MD note patient being admitted for acute encephalopathy/delirium, suspected seizure activity. CXR persistent right lower lobe airspace consolidation with pole minimally less opacity compared to 5 days previously. CT no evidence of acute hemorrhage or infarct. BSE recommended MBS. No Data Recorded  Assessment / Plan / Recommendation CHL IP CLINICAL IMPRESSIONS 10/09/2015 Therapy Diagnosis Moderate oral phase dysphagia;Mild pharyngeal phase dysphagia Clinical Impression Pt exhibited delayed oral manipulation and transit more so with regular texture and anterior spillage and anterior buccal retainment with thin barium. Mild senorimotor impairments led to delayed swallow to the pyriform sinuses and  mild vallecular and pyriform residue (not sensed). Verbal and visual feedback for double swallow was effective in clearance. No penetration or aspiration observed however mildly increased risk due to increased work of breathing during meals. Recommend Dys 2 texture and thin liquids, no straws, pills whole in applesauce and double swallow.     CHL IP TREATMENT RECOMMENDATION 10/09/2015 Treatment Recommendations Therapy as outlined in treatment plan below   CHL IP DIET RECOMMENDATION 10/09/2015 SLP Diet Recommendations Dysphagia 2 (Fine chop);Thin Liquid Administration via (None) Medication Administration Whole meds with puree Compensations Slow rate;Small sips/bites;Check for pocketing Postural Changes and/or Swallow Maneuvers (None)   CHL IP OTHER RECOMMENDATIONS 10/09/2015 Recommended Consults (None) Oral Care Recommendations Oral care QID Other Recommendations (None)   No flowsheet data found.  CHL IP FREQUENCY AND DURATION 10/09/2015 Speech Therapy Frequency (ACUTE ONLY) min 2x/week Treatment Duration 2 weeks   Pertinent Vitals/Pain  SLP Swallow Goals No flowsheet data found. No flowsheet data found.   CHL IP REASON FOR REFERRAL 10/09/2015 Reason for Referral Objectively evaluate swallowing function       No flowsheet data found.   [name] 10/09/2015, 11:14 AM [name] [name].Ed CCC-SLP Pager [phone]    Medications:  Scheduled: . antiseptic oral rinse  7 mL Mouth Rinse q12n4p  . aspirin EC  81 mg Oral Daily  . carvedilol  3.125 mg Oral BID WC  . chlorhexidine  15 mL Mouth Rinse BID  . folic acid  1 mg Oral Daily  . levETIRAcetam  500 mg Intravenous Q12H  . lisinopril  2.5 mg Oral QPM  . nystatin  5 mL Mouth/Throat QID  . piperacillin-tazobactam (ZOSYN)  IV  3.375 g Intravenous Q8H  . piperacillin-tazobactam  3.375 g Intravenous Once  . QUEtiapine  25 mg Oral QHS  . sodium chloride  3 mL Intravenous Q12H  . sodium chloride  3 mL Intravenous Q12H  . thiamine  100 mg Oral Daily  .  ticagrelor  90 mg Oral BID    Assessment/Plan: 81 YO male with confusion and questionable history of seizure. Patient denies history of seizure.  EEG normal and MRI showing no acute pathology. RPR reactive, T Pallidium Abs positive and Rapid plasma reagin 1.2.  No seizure activity over night.  Recommend: 1) continue Keppra 2) ID consult for syphilis --if they recommend a LP for possible neuro syphilis this can be done tomorrow AM as he received Lovenox last night.      Felicie Morn PA-C [facility] [phone]  10/10/2015, 8:52 AM Patient seen and examined together with physician assistant and I concur with the assessment and plan.  Wyatt Portela, MD

## 2015-10-10 NOTE — Progress Notes (Signed)
[facility] PROGRESS NOTE  Justin Figueroa ZOX:[id number] DOB: 1931/03/05 DOA: 10/08/2015 PCP: [name],ASHISH, MD  Assessment/Plan:  Acute encephalopathy -Uncertain etiology. CT head and MRI brain with no acute findings,TSH ammonia ,and B-12 WNL,  Differential includes  seizure, medications, infectious and Health Care associated Delirium -started on low dose seroquel -continue Sitter at bedside.  -continue treatment for PNA/prostatitis  -Neurology input appreciated . - Treponema pallidum antibodies positive, RPR reactive, rapid plasma reagin 1;2, ID consult requested to evaluate for possible neuro syphilis , Lovenox has been held for possible need for LP .  Chest pain -In the setting of recent anterior STEMI in August 2016. -EKG does not show concerning ST changes. -will continue PPI, GI cocktail, continue home meds for recent ischemic cardiomyopathy including: Coreg, lisinopril, Brilinta and aspirin -flat troponin elevation and no CP today on exam; most likely demand ischemia  Possible seizure -Nursing home RN describes what she thought was seizure-like activity. Sister states he has a history of epilepsy. Not on antiepileptic medications. -EEG w/o epileptiform activity -will follow neurology rec's -currently on keppra   Prostatitis -Being treated with Zosyn. Will continue.  -Current UA shows hematuria but no nitrite -will follow culture data.  Recent community acquired pneumonia -Chest x-ray is improved compared to 5 days ago but still shows left lower lobe pneumonia.  -Will continue Zosyn -Follow recommendations from SPL  Trush -will treat with nystatin    Code Status: Full Family Communication: no family at bedside  Disposition Plan: remains in the hospital;will need SNF placment when stable.   Consultants:  Neurology (Dr. Leroy Kennedyamilo)  Procedures:  EEG: characterized by slowing which is consistent with normal drowse. Can not rule out the possibility of slowing  related to general cerebral disturbance such as a metabolic encephalopathy. Clinical correlation recommended. No epileptiform activity is noted.   Antibiotics:  Zosyn 11/04  HPI/Subjective: Afebrile, no chest pain, no shortness of breath. Denies any complaints  Objective: Filed Vitals:   10/10/15 1342  BP: 112/72  Pulse: 86  Temp: 98.1 F (36.7 C)  Resp: 18    Intake/Output Summary (Last 24 hours) at 10/10/15 1521 Last data filed at 10/10/15 1230  Gross per 24 hour  Intake    460 ml  Output   2025 ml  Net  -1565 ml   Filed Weights   10/08/15 1704 10/09/15 0422 10/10/15 0415  Weight: 64.8 kg (142 lb 13.7 oz) 65 kg (143 lb 4.8 oz) 62.6 kg (138 lb 0.1 oz)    Exam:   General:   awake, communicative    Cardiovascular: S1 and S2, no rubs, no gallops   Respiratory: good air movement, no wheezing, no crackles  Abdomen: Soft, nontender, positive bowel sounds  Musculoskeletal: No edema, no cyanosis.    Data Reviewed: Basic Metabolic Panel:  Recent Labs Lab 10/08/15 1154 10/09/15 0404  NA 139 138  K 4.3 4.0  CL 104 103  CO2 28 26  GLUCOSE 85 72  BUN <5* <5*  CREATININE 0.70 0.72  CALCIUM 7.5* 7.6*   Liver Function Tests:  Recent Labs Lab 10/08/15 1154 10/09/15 0404  AST 58* 72*  ALT 27 30  ALKPHOS 52 59  BILITOT 1.4* 1.4*  PROT 5.3* 5.3*  ALBUMIN 2.1* 2.0*    Recent Labs Lab 10/08/15 1154  LIPASE 78*    Recent Labs Lab 10/08/15 1606  AMMONIA 20   CBC:  Recent Labs Lab 10/08/15 1200 10/09/15 0404  WBC 13.5* 12.4*  NEUTROABS 11.7*  --  HGB 13.3 13.4  HCT 40.6 40.8  MCV 87.7 88.1  PLT 381 367   Cardiac Enzymes:  Recent Labs Lab 10/08/15 1607 10/09/15 0404 10/09/15 0920  TROPONINI 0.06* 0.06* 0.06*   BNP (last 3 results)  Recent Labs  07/19/15 1630 07/20/15 0309  BNP 189.4* 682.1*   Studies: Ct Head Wo Contrast  10/08/2015  CLINICAL DATA:  Acute encephalopathy, confusion EXAM: CT HEAD WITHOUT CONTRAST TECHNIQUE:  Contiguous axial images were obtained from the base of the skull through the vertex without intravenous contrast. COMPARISON:  09/28/2015 FINDINGS: Moderate diffuse age-related atrophy. Mild chronic white matter disease. Tiny stable lacunar infarct left thalamus. No evidence of vascular territory infarct hemorrhage mass or extra-axial fluid. No hydrocephalus. Minimal age-related basal ganglia calcification bilaterally. Left mastoid air cells are nearly completely opacified. Partial opacification right mastoid air cells. IMPRESSION: No evidence of acute hemorrhage or infarct. New opacification left mastoid air cells, suggesting the presence of fluid which can be seen with mastoiditis. Electronically Signed   By: Esperanza Heir M.D.   On: 10/08/2015 15:36   Mr Brain Wo Contrast  10/09/2015  CLINICAL DATA:  Altered mental status EXAM: MRI HEAD WITHOUT CONTRAST TECHNIQUE: Multiplanar, multiecho pulse sequences of the brain and surrounding structures were obtained without intravenous contrast. COMPARISON:  CT head 10/08/2015 FINDINGS: Moderate generalized atrophy. Ventricular enlargement consistent with atrophy. Pituitary not enlarged. Negative for acute infarct.  No significant chronic ischemia. Negative for hemorrhage or mass.  No shift of the midline structures Mastoid sinus effusion bilaterally. Paranasal sinuses clear. Normal orbit. IMPRESSION: Generalized atrophy.  Negative for acute infarct Bilateral mastoid sinus effusion. Electronically Signed   By: Marlan Palau M.D.   On: 10/09/2015 20:56   Dg Swallowing Func-speech Pathology  10/09/2015  m Objective Swallowing Evaluation:   MBS Patient Details Name: Justin Figueroa MRN: [id number] Date of Birth: 09-15-1931 Today's Date: 10/09/2015 Time: SLP Start Time (ACUTE ONLY): 1028-SLP Stop Time (ACUTE ONLY): 1045 SLP Time Calculation (min) (ACUTE ONLY): 17 min Past Medical History: Past Medical History Diagnosis Date . CAD (coronary artery disease)    a. 07/2015  Ant STEMI/PCI: LM nl, LAD 70/100 (3.0x28 Promus DES), LCX 50m, RCA 50p. EF 35%. . Ischemic cardiomyopathy    a. 07/2015 Echo: EF 25%, diff HK, mildly dil RV w/ mildly reduced fxn, mildly dil LA. Marland Kitchen Hyperlipidemia  . Altered mental status  Past Surgical History: Past Surgical History Procedure Laterality Date . Cardiac catheterization N/A 07/19/2015   Procedure: Left Heart Cath and Coronary Angiography;  Surgeon: Tonny Bollman, MD;  Location[facility] INVASIVE CV LAB;  Service: Cardiovascular;  Laterality: N/A; HPI: Other Pertinent Information: 81 year old male with a history of ischemic cardiopathy myopathy, coronary disease, recent community-acquired pneumonia prostatitis admitted with acute encephalopathy and chest pain from a nursing facility. Per MD note patient being admitted for acute encephalopathy/delirium, suspected seizure activity. CXR persistent right lower lobe airspace consolidation with pole minimally less opacity compared to 5 days previously. CT no evidence of acute hemorrhage or infarct. BSE recommended MBS. No Data Recorded Assessment / Plan / Recommendation CHL IP CLINICAL IMPRESSIONS 10/09/2015 Therapy Diagnosis Moderate oral phase dysphagia;Mild pharyngeal phase dysphagia Clinical Impression Pt exhibited delayed oral manipulation and transit more so with regular texture and anterior spillage and anterior buccal retainment with thin barium. Mild senorimotor impairments led to delayed swallow to the pyriform sinuses and mild vallecular and pyriform residue (not sensed). Verbal and visual feedback for double swallow was effective in clearance. No penetration or aspiration observed however  mildly increased risk due to increased work of breathing during meals. Recommend Dys 2 texture and thin liquids, no straws, pills whole in applesauce and double swallow.     CHL IP TREATMENT RECOMMENDATION 10/09/2015 Treatment Recommendations Therapy as outlined in treatment plan below   CHL IP DIET RECOMMENDATION  10/09/2015 SLP Diet Recommendations Dysphagia 2 (Fine chop);Thin Liquid Administration via (None) Medication Administration Whole meds with puree Compensations Slow rate;Small sips/bites;Check for pocketing Postural Changes and/or Swallow Maneuvers (None)   CHL IP OTHER RECOMMENDATIONS 10/09/2015 Recommended Consults (None) Oral Care Recommendations Oral care QID Other Recommendations (None)   No flowsheet data found.  CHL IP FREQUENCY AND DURATION 10/09/2015 Speech Therapy Frequency (ACUTE ONLY) min 2x/week Treatment Duration 2 weeks   Pertinent Vitals/Pain  SLP Swallow Goals No flowsheet data found. No flowsheet data found.   CHL IP REASON FOR REFERRAL 10/09/2015 Reason for Referral Objectively evaluate swallowing function       No flowsheet data found.   [name] 10/09/2015, 11:14 AM [name] [name].Ed CCC-SLP Pager [phone]    Scheduled Meds: . antiseptic oral rinse  7 mL Mouth Rinse q12n4p  . aspirin EC  81 mg Oral Daily  . carvedilol  3.125 mg Oral BID WC  . chlorhexidine  15 mL Mouth Rinse BID  . folic acid  1 mg Oral Daily  . levETIRAcetam  500 mg Intravenous Q12H  . lisinopril  2.5 mg Oral QPM  . nystatin  5 mL Mouth/Throat QID  . piperacillin-tazobactam (ZOSYN)  IV  3.375 g Intravenous Q8H  . piperacillin-tazobactam  3.375 g Intravenous Once  . QUEtiapine  25 mg Oral QHS  . sodium chloride  3 mL Intravenous Q12H  . sodium chloride  3 mL Intravenous Q12H  . thiamine  100 mg Oral Daily  . ticagrelor  90 mg Oral BID   Continuous Infusions:   Principal Problem:   Acute delirium Active Problems:   ST elevation (STEMI) myocardial infarction involving left anterior descending coronary artery [facility])   Psychiatric illness   Ischemic cardiomyopathy   Hyperlipidemia   Prostatitis   Chest pain   Seizure (HCC)    Time spent: 30 minutes    Rafeef Lau  [facility] Pager [phone]. If 7PM-7AM, please contact night-coverage at [email or web address], password  [facility] 10/10/2015, 3:21 PM  LOS: 2 days

## 2015-10-10 NOTE — Progress Notes (Signed)
Dr Clearence PedSchorr notified of Troponin level 0.04. No new order given. Will continue to monitor. Laurann MontanaFolashade Alabi,RN

## 2015-10-10 NOTE — Evaluation (Signed)
Occupational Therapy Evaluation Patient Details Name: Justin Figueroa MRN: [id number] DOB: 14-Apr-1931 Today's Date: 10/10/2015    History of Present Illness Patient is a 81 y/o male presents with hx of ischemic cardiopathy myopathy, CAD, STEMI, recently d/ced from [facility] 11/4 with CAP and prostatitis presents with acute encephalopathy and CP from a nursing facility. Admitted for acute encephalopathy/delirium, suspected seizure activity. CXR-Minimal pleural effusions bilaterally.    Clinical Impression   Pt admitted with above. He demonstrates the below listed deficits and will benefit from continued OT to maximize safety and independence with BADLs.  Pt presents to OT with generalized weakness and impaired balance.  Currently, he requires min A for ADLs and fatigues quickly.   Will follow acutely and return to SNF at discharge.       Follow Up Recommendations  SNF    Equipment Recommendations  None recommended by OT    Recommendations for Other Services       Precautions / Restrictions Precautions Precautions: Fall      Mobility Bed Mobility Overal bed mobility: Needs Assistance Bed Mobility: Supine to Sit;Sit to Supine     Supine to sit: Supervision Sit to supine: Supervision   General bed mobility comments: uses rails. requires increased time.   Transfers Overall transfer level: Needs assistance Equipment used: Rolling walker (2 wheeled) Transfers: Sit to/from [employer] Sit to Stand: Min assist Stand pivot transfers: Min assist       General transfer comment: min A for balance and to move into standing     Balance Overall balance assessment: Needs assistance Sitting-balance support: Feet supported Sitting balance-Leahy Scale: Good     Standing balance support: Bilateral upper extremity supported Standing balance-Leahy Scale: Poor Standing balance comment: requires UE assist                             ADL  Overall ADL's : Needs assistance/impaired Eating/Feeding: Set up;Sitting;Bed level   Grooming: Wash/dry hands;Wash/dry face;Brushing hair;Minimal assistance;Standing   Upper Body Bathing: Minimal assitance;Sitting   Lower Body Bathing: Minimal assistance;Sit to/from stand   Upper Body Dressing : Minimal assistance;Sitting   Lower Body Dressing: Minimal assistance;Sit to/from stand Lower Body Dressing Details (indicate cue type and reason): Pt able to don/doff socks while EOB.  Requires min A for balance and sit to stand  Toilet Transfer: Minimal assistance;Stand-pivot;BSC;RW;Ambulation   Toileting- Clothing Manipulation and Hygiene: Minimal assistance;Sit to/from stand       Functional mobility during ADLs: Minimal assistance;Rolling walker General ADL Comments: reqquires assist for balance      Vision     Perception     Praxis      Pertinent Vitals/Pain Pain Assessment: No/denies pain     Hand Dominance     Extremity/Trunk Assessment Upper Extremity Assessment Upper Extremity Assessment: Generalized weakness   Lower Extremity Assessment Lower Extremity Assessment: Defer to PT evaluation   Cervical / Trunk Assessment Cervical / Trunk Assessment: Kyphotic   Communication Communication Communication: HOH;Expressive difficulties (speech difficult to understand )   Cognition Arousal/Alertness: Awake/alert Behavior During Therapy: WFL for tasks assessed/performed Overall Cognitive Status: No family/caregiver present to determine baseline cognitive functioning                     General Comments       Exercises       Shoulder Instructions      Home Living Family/patient expects to be discharged to:: Skilled  nursing facility                                 Additional Comments: Pt admitted from SNF, prior to SNF, pt was living at home, per chart review       Prior Functioning/Environment Level of Independence: Needs assistance   Gait / Transfers Assistance Needed: Reports using RW PTA. per notes, [facility] facility on [facility]/4. ADL's / Homemaking Assistance Needed: speech difficult to understand, unable to determine, pt's PLOF        OT Diagnosis: Generalized weakness;Cognitive deficits   OT Problem List: Decreased strength;Decreased activity tolerance;Impaired balance (sitting and/or standing);Decreased safety awareness;Decreased knowledge of use of DME or AE;Cardiopulmonary status limiting activity   OT Treatment/Interventions: Self-care/ADL training;DME and/or AE instruction;Therapeutic activities;Patient/family education;Balance training    OT Goals(Current goals can be found in the care plan section) Acute Rehab OT Goals Patient Stated Goal: did not state  OT Goal Formulation: With patient Time For Goal Achievement: 10/24/15 Potential to Achieve Goals: Good ADL Goals Pt Will Perform Grooming: with min guard assist;standing Pt Will Perform Upper Body Bathing: with set-up;sitting Pt Will Perform Lower Body Bathing: with min guard assist;sit to/from stand Pt Will Perform Lower Body Dressing: with min guard assist;sit to/from stand Pt Will Transfer to Toilet: with min guard assist;ambulating;regular height toilet;bedside commode;grab bars Pt Will Perform Toileting - Clothing Manipulation and hygiene: with min guard assist;sit to/from stand  OT Frequency: Min 2X/week   Barriers to D/C: Decreased caregiver support          Co-evaluation              End of Session Equipment Utilized During Treatment: Rolling walker Nurse Communication: Mobility status  Activity Tolerance: Patient tolerated treatment well Patient left: in bed;with call bell/phone within reach;with nursing/sitter in room;with bed alarm set   Time: 1207-1225 OT Time Calculation (min): 18 min Charges:  OT General Charges $OT Visit: 1 Procedure OT Evaluation $Initial OT Evaluation Tier I: 1  Procedure G-Codes:    Agron Swiney M 10/10/2015, 1:00 PM

## 2015-10-11 ENCOUNTER — Inpatient Hospital Stay (HOSPITAL_COMMUNITY): Payer: Medicare Other

## 2015-10-11 LAB — CBC
HEMATOCRIT: 39.6 % (ref 39.0–52.0)
Hemoglobin: 13 g/dL (ref 13.0–17.0)
MCH: 28.6 pg (ref 26.0–34.0)
MCHC: 32.8 g/dL (ref 30.0–36.0)
MCV: 87 fL (ref 78.0–100.0)
Platelets: 455 10*3/uL — ABNORMAL HIGH (ref 150–400)
RBC: 4.55 MIL/uL (ref 4.22–5.81)
RDW: 16.5 % — AB (ref 11.5–15.5)
WBC: 13 10*3/uL — AB (ref 4.0–10.5)

## 2015-10-11 LAB — PSA: PSA: 4.73 ng/mL — ABNORMAL HIGH (ref 0.00–4.00)

## 2015-10-11 LAB — BASIC METABOLIC PANEL
ANION GAP: 9 (ref 5–15)
BUN: 7 mg/dL (ref 6–20)
CALCIUM: 7.9 mg/dL — AB (ref 8.9–10.3)
CO2: 25 mmol/L (ref 22–32)
Chloride: 103 mmol/L (ref 101–111)
Creatinine, Ser: 0.9 mg/dL (ref 0.61–1.24)
GFR calc Af Amer: >60 mL/min (ref >60)
GFR calc non Af Amer: >60 mL/min (ref >60)
GLUCOSE: 149 mg/dL — AB (ref 65–99)
Potassium: 4.2 mmol/L (ref 3.5–5.1)
Sodium: 137 mmol/L (ref 135–145)

## 2015-10-11 MED ORDER — SODIUM CHLORIDE 0.9 % IV SOLN: INTRAVENOUS | Status: DC

## 2015-10-11 MED ORDER — LEVOFLOXACIN IN D5W 750 MG/150ML IV SOLN
750.0000 mg | INTRAVENOUS | Status: DC
  Administered 2015-10-11: 750 mg via INTRAVENOUS
  Filled 2015-10-11: qty 150

## 2015-10-11 MED ORDER — SODIUM CHLORIDE 0.9 % IV BOLUS (SEPSIS)
200.0000 mL | Freq: Once | INTRAVENOUS | Status: AC
  Administered 2015-10-11: 200 mL via INTRAVENOUS

## 2015-10-11 MED ORDER — LEVOFLOXACIN IN D5W 750 MG/150ML IV SOLN: 750.0000 mg | INTRAVENOUS | Status: DC

## 2015-10-11 NOTE — Progress Notes (Signed)
Subjective: patient confused and talking nonsensically. Per nurse very hypotensive with BP 80/50 after bolus of 250 cc.   Objective: Current vital signs: BP 85/57 mmHg  Pulse 97  Temp(Src) 99.1 F (37.3 C) (Oral)  Resp 18  Ht 5\' 7"  (1.702 m)  Wt 60 kg (132 lb 4.4 oz)  BMI 20.71 kg/m2  SpO2 92% Vital signs in last 24 hours: Temp:  [97.4 F (36.3 C)-99.1 F (37.3 C)] 99.1 F (37.3 C) (11/10 0612) Pulse Rate:  [78-97] 97 (11/10 0820) Resp:  [18-20] 18 (11/10 0612) BP: (77-131)/(45-76) 85/57 mmHg (11/10 0955) SpO2:  [92 %-98 %] 92 % (11/10 0819) Weight:  [60 kg (132 lb 4.4 oz)] 60 kg (132 lb 4.4 oz) (11/10 0612)  Intake/Output from previous day: 11/09 0701 - 11/10 0700 In: 750 [P.O.:700; IV Piggyback:50] Out: 2525 [Urine:2525] Intake/Output this shift:   Nutritional status: DIET DYS 2 Room service appropriate?: Yes; Fluid consistency:: Thin  Neurologic Exam: General: NAD Mental Status: Alert, not oriented. Speech fluent nonsensical .  Able to follow commands without difficulty. Cranial Nerves: II:  Visual fields grossly normal, pupils equal, round, reactive to light and accommodation III,IV, VI: ptosis not present, extra-ocular motions intact bilaterally V,VII: smile symmetric, facial light touch sensation normal bilaterally VIII: hearing normal bilaterally IX,X: uvula rises symmetrically XI: bilateral shoulder shrug XII: midline tongue extension without atrophy or fasciculations  Motor: Moving all extremities antigravity Sensory: Pinprick and light touch intact throughout, bilaterally   Lab Results: Basic Metabolic Panel:  Recent Labs Lab 10/08/15 1154 10/09/15 0404 10/11/15 0254  NA 139 138 137  K 4.3 4.0 4.2  CL 104 103 103  CO2 28 26 25   GLUCOSE 85 72 149*  BUN <5* <5* 7  CREATININE 0.70 0.72 0.90  CALCIUM 7.5* 7.6* 7.9*    Liver Function Tests:  Recent Labs Lab 10/08/15 1154 10/09/15 0404  AST 58* 72*  ALT 27 30  ALKPHOS 52 59  BILITOT  1.4* 1.4*  PROT 5.3* 5.3*  ALBUMIN 2.1* 2.0*    Recent Labs Lab 10/08/15 1154  LIPASE 78*    Recent Labs Lab 10/08/15 1606  AMMONIA 20    CBC:  Recent Labs Lab 10/08/15 1200 10/09/15 0404 10/11/15 0254  WBC 13.5* 12.4* 13.0*  NEUTROABS 11.7*  --   --   HGB 13.3 13.4 13.0  HCT 40.6 40.8 39.6  MCV 87.7 88.1 87.0  PLT 381 367 455*    Cardiac Enzymes:  Recent Labs Lab 10/08/15 1607 10/09/15 0404 10/09/15 0920 10/10/15 2121  TROPONINI 0.06* 0.06* 0.06* 0.04*    Lipid Panel: No results for input(s): CHOL, TRIG, HDL, CHOLHDL, VLDL, LDLCALC in the last 168 hours.  CBG: No results for input(s): GLUCAP in the last 168 hours.  Microbiology: Results for orders placed or performed during the hospital encounter of 07/19/15  MRSA PCR Screening     Status: None   Collection Time: 07/19/15  4:10 PM  Result Value Ref Range Status   MRSA by PCR NEGATIVE NEGATIVE Final    Comment:        The GeneXpert MRSA Assay (FDA approved for NASAL specimens only), is one component of a comprehensive MRSA colonization surveillance program. It is not intended to diagnose MRSA infection nor to guide or monitor treatment for MRSA infections.     Coagulation Studies: No results for input(s): LABPROT, INR in the last 72 hours.  Imaging: Mr Brain Wo Contrast  10/09/2015  CLINICAL DATA:  Altered mental status EXAM: MRI HEAD  WITHOUT CONTRAST TECHNIQUE: Multiplanar, multiecho pulse sequences of the brain and surrounding structures were obtained without intravenous contrast. COMPARISON:  CT head 10/08/2015 FINDINGS: Moderate generalized atrophy. Ventricular enlargement consistent with atrophy. Pituitary not enlarged. Negative for acute infarct.  No significant chronic ischemia. Negative for hemorrhage or mass.  No shift of the midline structures Mastoid sinus effusion bilaterally. Paranasal sinuses clear. Normal orbit. IMPRESSION: Generalized atrophy.  Negative for acute infarct  Bilateral mastoid sinus effusion. Electronically Signed   By: Marlan Palau M.D.   On: 10/09/2015 20:56    Medications:  Scheduled: . antiseptic oral rinse  7 mL Mouth Rinse q12n4p  . aspirin EC  81 mg Oral Daily  . carvedilol  3.125 mg Oral BID WC  . chlorhexidine  15 mL Mouth Rinse BID  . folic acid  1 mg Oral Daily  . levETIRAcetam  500 mg Intravenous Q12H  . lisinopril  2.5 mg Oral QPM  . nystatin  5 mL Mouth/Throat QID  . piperacillin-tazobactam (ZOSYN)  IV  3.375 g Intravenous Q8H  . piperacillin-tazobactam  3.375 g Intravenous Once  . QUEtiapine  25 mg Oral QHS  . sodium chloride  3 mL Intravenous Q12H  . sodium chloride  3 mL Intravenous Q12H  . thiamine  100 mg Oral Daily  . ticagrelor  90 mg Oral BID    Assessment/Plan:  81 YO male with confusion. EEG normal and MRI showing no acute pathology. RPR reactive, T Pallidium Abs positive and Rapid plasma reagin 1.2.  Per daughter he has been confused for about a month. This possibly could be neurosyphilis.  LP attempted but no CSF obtained.  Will need under fluoroscopy.   Recommend: 1) continue Keppra 2) LP under fluro    Felicie Morn PA-C [facility] [phone]  10/11/2015, 10:37 AM Patient seen and examined together with physician assistant and I concur with the assessment and plan.  Wyatt Portela, MD

## 2015-10-11 NOTE — Progress Notes (Addendum)
[facility] PROGRESS NOTE  Justin CreekBobby G Valdes RUE:[id number] DOB: 01-13-1931 DOA: 10/08/2015 PCP: [name],ASHISH, MD  Assessment/Plan:  Acute encephalopathy -Uncertain etiology. CT head and MRI brain with no acute findings,TSH ammonia ,and B-12 WNL,  Differential includes  seizure, medications, infectious and Health Care associated Delirium -started on low dose seroquel. -continue treatment for PNA/prostatitis  -Neurology input appreciated . - Treponema pallidum antibodies positive, RPR reactive, rapid plasma reagin 1;2, ID consult requested to evaluate for possible neuro syphilis , Lovenox has been held for for LP . Neurology Attempted LP today with no success, will need LP done under fluoroscopy.  Chest pain -In the setting of recent anterior STEMI in August 2016. -EKG does not show concerning ST changes. -will continue PPI, GI cocktail, continue home meds for recent ischemic cardiomyopathy including: Coreg, lisinopril, Brilinta and aspirin -flat troponin elevation and no CP today on exam; most likely demand ischemia  Chronic systolic CHF - Recent echo in 1/47828/2016 with EF 25%, continue with lisinopril, continue with Coreg,  - Patient was hypotensive this a.m. required fluid bolus, monitor closely for volume overload, currently appears to be euvolemic.  Possible seizure -Nursing home RN describes what she thought was seizure-like activity. Sister states he has a history of epilepsy. Not on antiepileptic medications. -EEG w/o epileptiform activity -will follow neurology rec's -currently on keppra   Prostatitis -Being treated with Zosyn. We'll change to levofloxacin -Current UA shows hematuria but no nitrite -will follow culture data.  Recent community acquired pneumonia -Chest x-ray is improved compared to 5 days ago but still shows left lower lobe pneumonia.  -Treated with Zosyn -Follow recommendations from [facility]  Trush -will treat with nystatin    Code Status: Full Family  Communication: no family at bedside  Disposition Plan: remains in the hospital;will need SNF placment when stable.   Consultants:  Neurology   Infectious disease   Procedures:  EEG: characterized by slowing which is consistent with normal drowse. Can not rule out the possibility of slowing related to general cerebral disturbance such as a metabolic encephalopathy. Clinical correlation recommended. No epileptiform activity is noted.   Antibiotics:  Zosyn 11/04  HPI/Subjective: Afebrile, no chest pain, no shortness of breath. Denies any complaints  Objective: Filed Vitals:   10/11/15 1409  BP: 101/66  Pulse: 89  Temp: 98.3 F (36.8 C)  Resp: 19    Intake/Output Summary (Last 24 hours) at 10/11/15 1521 Last data filed at 10/11/15 0845  Gross per 24 hour  Intake    360 ml  Output   1800 ml  Net  -1440 ml   Filed Weights   10/09/15 0422 10/10/15 0415 10/11/15 0612  Weight: 65 kg (143 lb 4.8 oz) 62.6 kg (138 lb 0.1 oz) 60 kg (132 lb 4.4 oz)    Exam:   General:   awake, communicative    Cardiovascular: S1 and S2, no rubs, no gallops   Respiratory: good air movement, no wheezing, no crackles  Abdomen: Soft, nontender, positive bowel sounds  Musculoskeletal: No edema, no cyanosis.    Data Reviewed: Basic Metabolic Panel:  Recent Labs Lab 10/08/15 1154 10/09/15 0404 10/11/15 0254  NA 139 138 137  K 4.3 4.0 4.2  CL 104 103 103  CO2 28 26 25   GLUCOSE 85 72 149*  BUN <5* <5* 7  CREATININE 0.70 0.72 0.90  CALCIUM 7.5* 7.6* 7.9*   Liver Function Tests:  Recent Labs Lab 10/08/15 1154 10/09/15 0404  AST 58* 72*  ALT 27 30  ALKPHOS  52 59  BILITOT 1.4* 1.4*  PROT 5.3* 5.3*  ALBUMIN 2.1* 2.0*    Recent Labs Lab 10/08/15 1154  LIPASE 78*    Recent Labs Lab 10/08/15 1606  AMMONIA 20   CBC:  Recent Labs Lab 10/08/15 1200 10/09/15 0404 10/11/15 0254  WBC 13.5* 12.4* 13.0*  NEUTROABS 11.7*  --   --   HGB 13.3 13.4 13.0  HCT 40.6  40.8 39.6  MCV 87.7 88.1 87.0  PLT 381 367 455*   Cardiac Enzymes:  Recent Labs Lab 10/08/15 1607 10/09/15 0404 10/09/15 0920 10/10/15 2121  TROPONINI 0.06* 0.06* 0.06* 0.04*   BNP (last 3 results)  Recent Labs  07/19/15 1630 07/20/15 0309  BNP 189.4* 682.1*   Studies: Mr Brain Wo Contrast  10/09/2015  CLINICAL DATA:  Altered mental status EXAM: MRI HEAD WITHOUT CONTRAST TECHNIQUE: Multiplanar, multiecho pulse sequences of the brain and surrounding structures were obtained without intravenous contrast. COMPARISON:  CT head 10/08/2015 FINDINGS: Moderate generalized atrophy. Ventricular enlargement consistent with atrophy. Pituitary not enlarged. Negative for acute infarct.  No significant chronic ischemia. Negative for hemorrhage or mass.  No shift of the midline structures Mastoid sinus effusion bilaterally. Paranasal sinuses clear. Normal orbit. IMPRESSION: Generalized atrophy.  Negative for acute infarct Bilateral mastoid sinus effusion. Electronically Signed   By: Marlan Palau M.D.   On: 10/09/2015 20:56    Scheduled Meds: . antiseptic oral rinse  7 mL Mouth Rinse q12n4p  . aspirin EC  81 mg Oral Daily  . carvedilol  3.125 mg Oral BID WC  . chlorhexidine  15 mL Mouth Rinse BID  . folic acid  1 mg Oral Daily  . levETIRAcetam  500 mg Intravenous Q12H  . lisinopril  2.5 mg Oral QPM  . nystatin  5 mL Mouth/Throat QID  . piperacillin-tazobactam (ZOSYN)  IV  3.375 g Intravenous Q8H  . piperacillin-tazobactam  3.375 g Intravenous Once  . QUEtiapine  25 mg Oral QHS  . sodium chloride  3 mL Intravenous Q12H  . sodium chloride  3 mL Intravenous Q12H  . thiamine  100 mg Oral Daily  . ticagrelor  90 mg Oral BID   Continuous Infusions:   Principal Problem:   Acute delirium Active Problems:   ST elevation (STEMI) myocardial infarction involving left anterior descending coronary artery [facility])   Psychiatric illness   Ischemic cardiomyopathy   Hyperlipidemia   Prostatitis    Chest pain   Seizure (HCC)    Time spent: 30 minutes    [name]  [facility] Pager [phone]. If 7PM-7AM, please contact night-coverage at [email or web address], password [facility] 10/11/2015, 3:21 PM  LOS: 3 days

## 2015-10-11 NOTE — Progress Notes (Signed)
Patient BP at 0824 was 81/52. Pt asymptomatic; denies dizziness or pain. MD notified and ordered 200mL bolus over 1 hour. Held Coreg per MD order. BP after bolus was 85/57. MD made aware. Pt comfortable, asymptomatic, call bell within reach, bed alarm on.  Leonidas Rombergaitlin S Bumbledare, RN

## 2015-10-11 NOTE — Progress Notes (Signed)
ANTIBIOTIC CONSULT NOTE  Pharmacy Consult for Levofloxacin Indication: pneumonia and prostatitis  No Known Allergies  Patient Measurements: Height: 5\' 7"  (170.2 cm) Weight: 132 lb 4.4 oz (60 kg) IBW/kg (Calculated) : 66.1  Vital Signs: Temp: 98.3 F (36.8 C) (11/10 1409) Temp Source: Oral (11/10 1409) BP: 101/66 mmHg (11/10 1409) Pulse Rate: 89 (11/10 1409) Intake/Output from previous day: 11/09 0701 - 11/10 0700 In: 750 [P.O.:700; IV Piggyback:50] Out: 2525 [Urine:2525] Intake/Output from this shift:    Labs:  Recent Labs  10/09/15 0404 10/11/15 0254  WBC 12.4* 13.0*  HGB 13.4 13.0  PLT 367 455*  CREATININE 0.72 0.90   Estimated Creatinine Clearance: 53.7 mL/min (by C-G formula based on Cr of 0.9). No results for input(s): VANCOTROUGH, VANCOPEAK, VANCORANDOM, GENTTROUGH, GENTPEAK, GENTRANDOM, TOBRATROUGH, TOBRAPEAK, TOBRARND, AMIKACINPEAK, AMIKACINTROU, AMIKACIN in the last 72 hours.   Microbiology: No results found for this or any previous visit (from the past 720 hour(s)).  Anti-infectives    Start     Dose/Rate Route Frequency Ordered Stop   10/12/15 0030  levofloxacin (LEVAQUIN) IVPB 750 mg  Status:  Discontinued     750 mg 100 mL/hr over 90 Minutes Intravenous Every 24 hours 10/11/15 1611 10/11/15 1613   10/11/15 2230  levofloxacin (LEVAQUIN) IVPB 750 mg     750 mg 100 mL/hr over 90 Minutes Intravenous Every 24 hours 10/11/15 1613     10/08/15 2200  piperacillin-tazobactam (ZOSYN) IVPB 3.375 g  Status:  Discontinued     3.375 g 12.5 mL/hr over 240 Minutes Intravenous Every 8 hours 10/08/15 1759 10/11/15 1540   10/08/15 1830  piperacillin-tazobactam (ZOSYN) IVPB 3.375 g  Status:  Discontinued     3.375 g 100 mL/hr over 30 Minutes Intravenous  Once 10/08/15 1813 10/11/15 1544   10/08/15 1600  piperacillin-tazobactam (ZOSYN) IVPB 3.375 g  Status:  Discontinued     3.375 g 100 mL/hr over 30 Minutes Intravenous  Once 10/08/15 1529 10/08/15 1813       Assessment: 81yo male now switching from Zosyn to levofloxacin for prostatitis, PNA resolving. Renal function stable. He is afebrile, wbc remains mildly elevated at 13K. Per ID note, possibly needs penicillin as well for possible syphilis - RPR +, acute encephalopathy but will wait until after LP (not yet completed), to be given IM in 3x weekly doses. CrCl~53  Zosyn 11/4 >> 11/10 Levo 11/10 >>  Plan:  Levofloxacin 750mg  IV q24h Monitor clinical progress, c/s, renal function, abx plan/LOT F/u ID plan for starting penicillin ?     Babs BertinHaley Jerre Diguglielmo, PharmD Clinical Pharmacist Pager [phone] 10/11/2015 4:32 PM

## 2015-10-11 NOTE — Care Management Important Message (Signed)
Important Message  Patient Details  Name: Justin Figueroa MRN: [id number] Date of Birth: 06/04/1931   Medicare Important Message Given:  Yes    Justin Figueroa 10/11/2015, 3:17 PM

## 2015-10-11 NOTE — Procedures (Signed)
LP Procedure Note:  Patient has been seen and examined.  Chart has been reviewed.  LP is being performed to evaluate for neurosyphilis.  Procedure has been explained to daughter including risks and benefits.  Consent has been signed by witness.   Blood pressure 85/57, pulse 97, temperature 99.1 F (37.3 C), temperature source Oral, resp. rate 18, height 5\' 7"  (1.702 m), weight 60 kg (132 lb 4.4 oz), SpO2 92 %.   Current facility-administered medications:  .  acetaminophen (TYLENOL) tablet 650 mg, 650 mg, Oral, Q6H PRN **OR** acetaminophen (TYLENOL) suppository 650 mg, 650 mg, Rectal, Q6H PRN, Stephani PoliceMarianne L York, PA-C .  ALPRAZolam Prudy Feeler(XANAX) tablet 0.5 mg, 0.5 mg, Oral, Q8H PRN, Vassie Lollarlos Madera, MD, 0.5 mg at 10/11/15 0332 .  alum & mag hydroxide-simeth (MAALOX/MYLANTA) 200-200-20 MG/5ML suspension 30 mL, 30 mL, Oral, Q6H PRN, Tora KindredMarianne L York, PA-C, 30 mL at 10/10/15 1854 .  antiseptic oral rinse (CPC / CETYLPYRIDINIUM CHLORIDE 0.05%) solution 7 mL, 7 mL, Mouth Rinse, q12n4p, Vassie Lollarlos Madera, MD, 7 mL at 10/10/15 1612 .  aspirin EC tablet 81 mg, 81 mg, Oral, Daily, Stephani PoliceMarianne L York, PA-C, 81 mg at 10/10/15 1059 .  bisacodyl (DULCOLAX) suppository 10 mg, 10 mg, Rectal, Daily PRN, Stephani PoliceMarianne L York, PA-C .  carvedilol (COREG) tablet 3.125 mg, 3.125 mg, Oral, BID WC, Marianne L York, PA-C, 3.125 mg at 10/10/15 1611 .  chlorhexidine (PERIDEX) 0.12 % solution 15 mL, 15 mL, Mouth Rinse, BID, Vassie Lollarlos Madera, MD, 15 mL at 10/10/15 2121 .  folic acid (FOLVITE) tablet 1 mg, 1 mg, Oral, Daily, Tora KindredMarianne L York, PA-C, 1 mg at 10/10/15 1059 .  levETIRAcetam (KEPPRA) 500 mg in sodium chloride 0.9 % 100 mL IVPB, 500 mg, Intravenous, Q12H, Starleen Armsawood S Elgergawy, MD, 500 mg at 10/11/15 0546 .  lisinopril (PRINIVIL,ZESTRIL) tablet 2.5 mg, 2.5 mg, Oral, QPM, Marianne L York, PA-C, 2.5 mg at 10/10/15 1751 .  morphine 2 MG/ML injection 2 mg, 2 mg, Intravenous, Q3H PRN, Tora KindredMarianne L York, PA-C .  nitroGLYCERIN (NITROSTAT) SL tablet 0.4  mg, 0.4 mg, Sublingual, Q5 Min x 3 PRN, Stephani PoliceMarianne L York, PA-C, 0.4 mg at 10/10/15 1952 .  nystatin (MYCOSTATIN) 100000 UNIT/ML suspension 500,000 Units, 5 mL, Mouth/Throat, QID, Vassie Lollarlos Madera, MD, 500,000 Units at 10/10/15 2121 .  ondansetron (ZOFRAN) tablet 4 mg, 4 mg, Oral, Q6H PRN **OR** ondansetron (ZOFRAN) injection 4 mg, 4 mg, Intravenous, Q6H PRN, Tora KindredMarianne L York, PA-C .  piperacillin-tazobactam (ZOSYN) IVPB 3.375 g, 3.375 g, Intravenous, Q8H, Marquita PalmsCorey M Ball, RPH, 3.375 g at 10/11/15 [id number] .  piperacillin-tazobactam (ZOSYN) IVPB 3.375 g, 3.375 g, Intravenous, Once, [employer] Mutual of OmahaMaryann Mikhail, DO .  promethazine (PHENERGAN) tablet 12.5 mg, 12.5 mg, Oral, Q6H PRN, Tora KindredMarianne L York, PA-C .  QUEtiapine (SEROQUEL) tablet 25 mg, 25 mg, Oral, QHS, Vassie Lollarlos Madera, MD, 25 mg at 10/10/15 2121 .  senna-docusate (Senokot-S) tablet 1 tablet, 1 tablet, Oral, QHS PRN, Tora KindredMarianne L York, PA-C .  sodium chloride 0.9 % injection 3 mL, 3 mL, Intravenous, Q12H, Marianne L York, PA-C, 3 mL at 10/08/15 2200 .  sodium chloride 0.9 % injection 3 mL, 3 mL, Intravenous, Q12H, Marianne L York, PA-C, 3 mL at 10/08/15 2152 .  thiamine (VITAMIN B-1) tablet 100 mg, 100 mg, Oral, Daily, Stephani PoliceMarianne L York, PA-C, 100 mg at 10/10/15 1059 .  ticagrelor (BRILINTA) tablet 90 mg, 90 mg, Oral, BID, Stephani PoliceMarianne L York, PA-C, 90 mg at 10/10/15 2121   Recent Labs  10/09/15 0404 10/11/15 0254  WBC 12.4*  13.0*  HGB 13.4 13.0  HCT 40.8 39.6  PLT 367 455*    CT of head:  Patient was placed in the lateral decub/sitting position.  Area was cleaned with betadine and anesthetized with lidocaine.  Under sterile conditions 20G LP needle was placed at approximately L3-4 without difficulty.  Unable to obtain CSF fluid. He will need this done under fluoroscopy.   Felicie Morn PA-C [facility] [phone]  10/11/2015, 10:44 AM Patient seen and examined together with physician assistant and I concur with the assessment and plan.  Wyatt Portela, MD

## 2015-10-11 NOTE — Progress Notes (Signed)
ANTIBIOTIC CONSULT NOTE - FOLLOW UP  Pharmacy Consult for zosyn Indication: pneumonia and prostatitis  No Known Allergies  Patient Measurements: Height: 5\' 7"  (170.2 cm) Weight: 132 lb 4.4 oz (60 kg) IBW/kg (Calculated) : 66.1  Vital Signs: Temp: 99.1 F (37.3 C) (11/10 0612) Temp Source: Oral (11/10 0612) BP: 81/52 mmHg (11/10 0824) Pulse Rate: 97 (11/10 0820) Intake/Output from previous day: 11/09 0701 - 11/10 0700 In: 750 [P.O.:700; IV Piggyback:50] Out: 2525 [Urine:2525] Intake/Output from this shift:    Labs:  Recent Labs  10/08/15 1154 10/08/15 1200 10/09/15 0404 10/11/15 0254  WBC  --  13.5* 12.4* 13.0*  HGB  --  13.3 13.4 13.0  PLT  --  381 367 455*  CREATININE 0.70  --  0.72 0.90   Estimated Creatinine Clearance: 53.7 mL/min (by C-G formula based on Cr of 0.9). No results for input(s): VANCOTROUGH, VANCOPEAK, VANCORANDOM, GENTTROUGH, GENTPEAK, GENTRANDOM, TOBRATROUGH, TOBRAPEAK, TOBRARND, AMIKACINPEAK, AMIKACINTROU, AMIKACIN in the last 72 hours.   Microbiology: No results found for this or any previous visit (from the past 720 hour(s)).  Anti-infectives    Start     Dose/Rate Route Frequency Ordered Stop   10/08/15 2200  piperacillin-tazobactam (ZOSYN) IVPB 3.375 g     3.375 g 12.5 mL/hr over 240 Minutes Intravenous Every 8 hours 10/08/15 1759     10/08/15 1830  piperacillin-tazobactam (ZOSYN) IVPB 3.375 g     3.375 g 100 mL/hr over 30 Minutes Intravenous  Once 10/08/15 1813     10/08/15 1600  piperacillin-tazobactam (ZOSYN) IVPB 3.375 g  Status:  Discontinued     3.375 g 100 mL/hr over 30 Minutes Intravenous  Once 10/08/15 1529 10/08/15 1813      Assessment: 81yo male with history of CAD, HLD and seizure d/o [facility]MS and lower abdominal pain. Zosyn was started on 11/4, continue on admission, now RPR positive, concern for Neuro syphilis. ID seen, LP is indicated. Will need penicillin as well, but will wait until after LP  per ID. Continue zosyn for now. Renal function stable. He is afebrile, wbc remains mildly elevated at 13K.  Zosyn 11/4 >>   Plan:  Zosyn 3.375g IV Q 8 hrs F/u ID plan for starting penicillin ?     Bayard HuggerMei Makaylah Oddo, PharmD, BCPS  Clinical Pharmacist  Pager: [phone]   10/11/2015,9:48 AM

## 2015-10-12 DIAGNOSIS — N418 Other inflammatory diseases of prostate: Secondary | ICD-10-CM

## 2015-10-12 DIAGNOSIS — R4182 Altered mental status, unspecified: Secondary | ICD-10-CM

## 2015-10-12 LAB — BASIC METABOLIC PANEL
Anion gap: 7 (ref 5–15)
BUN: 13 mg/dL (ref 6–20)
CO2: 24 mmol/L (ref 22–32)
CREATININE: 0.64 mg/dL (ref 0.61–1.24)
Calcium: 7.8 mg/dL — ABNORMAL LOW (ref 8.9–10.3)
Chloride: 109 mmol/L (ref 101–111)
GFR calc Af Amer: >60 mL/min (ref >60)
Glucose, Bld: 83 mg/dL (ref 65–99)
Potassium: 4 mmol/L (ref 3.5–5.1)
SODIUM: 140 mmol/L (ref 135–145)

## 2015-10-12 LAB — CBC
HCT: 38.4 % — ABNORMAL LOW (ref 39.0–52.0)
Hemoglobin: 12.5 g/dL — ABNORMAL LOW (ref 13.0–17.0)
MCH: 28.5 pg (ref 26.0–34.0)
MCHC: 32.6 g/dL (ref 30.0–36.0)
MCV: 87.5 fL (ref 78.0–100.0)
PLATELETS: 411 10*3/uL — AB (ref 150–400)
RBC: 4.39 MIL/uL (ref 4.22–5.81)
RDW: 16.8 % — ABNORMAL HIGH (ref 11.5–15.5)
WBC: 11 10*3/uL — ABNORMAL HIGH (ref 4.0–10.5)

## 2015-10-12 MED ORDER — SODIUM CHLORIDE 0.9 % IJ SOLN: 10.0000 mL | INTRAMUSCULAR | Status: DC | PRN

## 2015-10-12 MED ORDER — PENICILLIN G BENZATHINE 2400000 UNIT/4ML IM SUSP: INTRAMUSCULAR | Status: DC

## 2015-10-12 MED ORDER — NYSTATIN 100000 UNIT/ML MT SUSP: 5.0000 mL | Freq: Four times a day (QID) | OROMUCOSAL | Status: AC

## 2015-10-12 MED ORDER — PENICILLIN G POTASSIUM 5000000 UNITS IJ SOLR: 4.0000 10*6.[IU] | INTRAVENOUS | Status: DC

## 2015-10-12 MED ORDER — QUETIAPINE FUMARATE 25 MG PO TABS: 25.0000 mg | ORAL_TABLET | Freq: Every day | ORAL | Status: DC

## 2015-10-12 MED ORDER — POTASSIUM CHLORIDE CRYS ER 20 MEQ PO TBCR: 20.0000 meq | EXTENDED_RELEASE_TABLET | Freq: Every day | ORAL | Status: DC

## 2015-10-12 MED ORDER — LEVETIRACETAM 500 MG PO TABS: 500.0000 mg | ORAL_TABLET | Freq: Two times a day (BID) | ORAL | Status: DC

## 2015-10-12 MED ORDER — PENICILLIN G POTASSIUM 5000000 UNITS IJ SOLR
4.0000 10*6.[IU] | INTRAVENOUS | Status: DC
  Filled 2015-10-12 (×3): qty 4

## 2015-10-12 MED ORDER — SENNOSIDES-DOCUSATE SODIUM 8.6-50 MG PO TABS: 1.0000 | ORAL_TABLET | Freq: Every evening | ORAL | Status: DC | PRN

## 2015-10-12 MED ORDER — ALPRAZOLAM 0.5 MG PO TABS: 0.5000 mg | ORAL_TABLET | Freq: Three times a day (TID) | ORAL | Status: DC | PRN

## 2015-10-12 MED ORDER — PENICILLIN G POTASSIUM 5000000 UNITS IJ SOLR
4.0000 10*6.[IU] | INTRAVENOUS | Status: DC
  Administered 2015-10-12 (×3): 4 10*6.[IU] via INTRAVENOUS
  Filled 2015-10-12 (×6): qty 4

## 2015-10-12 NOTE — Progress Notes (Addendum)
Pt had a foley catheter in place upon my assessment this morning. Foley was not documented in pts chart. Foley was removed at 12:00 pm today (10/12/2015) per Dr. Teena IraniElgergawy's order. Berdine DanceLauren Moffitt, RN

## 2015-10-12 NOTE — Care Management Note (Signed)
Case Management Note  Patient Details  Name: Arleta CreekBobby G Sankey MRN: [id number] Date of Birth: August 14, 1931  Subjective/Objective:      PNA/prostatitis, acute encephalopathy               Action/Plan: Scheduled dc to SNF with IV abx. NCM notified CSW of scheduled dc today.   Expected Discharge Date:  10/12/2015               Expected Discharge Plan:  Skilled Nursing Facility  In-House Referral:  Clinical Social Work  Discharge planning Services  CM Consult   Status of Service:  Completed, signed off  Medicare Important Message Given:  Yes Date Medicare IM Given:    Medicare IM give by:    Date Additional Medicare IM Given:    Additional Medicare Important Message give by:     If discussed at Long Length of Stay Meetings, dates discussed:    Additional Comments:  Elliot CousinShavis, Anjalina Bergevin Ellen, RN 10/12/2015, 10:20 AM

## 2015-10-12 NOTE — Progress Notes (Signed)
Speech Language Pathology Treatment: Dysphagia  Patient Details Name: Justin CreekBobby G Brosious MRN: [id number] DOB: [birth year] Today's Date: 10/12/2015 Time: [id number] SLP Time Calculation (min) (ACUTE ONLY): 9 min  Assessment / Plan / Recommendation Clinical Impression  Pt has no overt signs of aspiration, although he does require Mod cues from SLP for use of second swallow. Dys 2 textures remain the most appropriate diet given prolonged mastication. Would continue with current recommendations.   HPI HPI: 81 year old male with a history of ischemic cardiopathy myopathy, coronary disease, recent community-acquired pneumonia prostatitis admitted with acute encephalopathy and chest pain from a nursing facility. Per MD note patient being admitted for acute encephalopathy/delirium, suspected seizure activity. CXR persistent right lower lobe airspace consolidation with pole minimally less opacity compared to 5 days previously. CT no evidence of acute hemorrhage or infarct. BSE recommended MBS.      SLP Plan  Continue with current plan of care     Recommendations  Diet recommendations: Dysphagia 2 (fine chop);Thin liquid Liquids provided via: Cup;No straw Medication Administration: Whole meds with puree Supervision: Patient able to self feed;Full supervision/cueing for compensatory strategies Compensations: Slow rate;Small sips/bites;Multiple dry swallows after each bite/sip Postural Changes and/or Swallow Maneuvers: Seated upright 90 degrees       Oral Care Recommendations: Oral care QID Follow up Recommendations: Skilled Nursing facility Plan: Continue with current plan of care    Maxcine HamLaura Paiewonsky, M.A. CCC-SLP [phone]  Maxcine Hamaiewonsky, Keeyon Privitera 10/12/2015, 2:35 PM

## 2015-10-12 NOTE — Progress Notes (Signed)
Physical Therapy Treatment Patient Details Name: Justin Figueroa MRN: [id number] DOB: November 07, 1931 Today's Date: 10/12/2015    History of Present Illness Patient is a 81 y/o male presents with hx of ischemic cardiopathy myopathy, CAD, STEMI, recently d/ced from [facility] 11/4 with CAP and prostatitis presents with acute encephalopathy and CP from a nursing facility. Admitted for acute encephalopathy/delirium, suspected seizure activity. CXR-Minimal pleural effusions bilaterally.     PT Comments    Patient progressing slowly towards PT goals. More steady than previous session. Demonstrates DOE during activity/mobility. Sp02 decreased to low 80s on 2L/min 02. Tolerated there ex sitting EOB. Will continue to follow acutely to maximize independence and mobility.    Follow Up Recommendations  SNF     Equipment Recommendations  None recommended by PT    Recommendations for Other Services       Precautions / Restrictions Precautions Precautions: Fall Restrictions Weight Bearing Restrictions: No    Mobility  Bed Mobility Overal bed mobility: Needs Assistance Bed Mobility: Supine to Sit;Sit to Supine     Supine to sit: Supervision;HOB elevated Sit to supine: Supervision;HOB elevated   General bed mobility comments: uses rails. requires increased time.   Transfers Overall transfer level: Needs assistance Equipment used: Rolling walker (2 wheeled) Transfers: Sit to/from Stand Sit to Stand: Min assist         General transfer comment: min A to boost up to standing.   Ambulation/Gait Ambulation/Gait assistance: Min assist Ambulation Distance (Feet): 25 Feet Assistive device: Rolling walker (2 wheeled) Gait Pattern/deviations: Step-through pattern;Decreased stride length;Decreased step length - right;Decreased step length - left   Gait velocity interpretation: <1.8 ft/sec, indicative of risk for recurrent falls General Gait Details: Slow, mostly steady gait with  Min A for balance/safety due to lines. Dyspnea present. Sp02 difficult to read but dropped to ~low 80s on 2L/min 02.    Stairs            Wheelchair Mobility    Modified Rankin (Stroke Patients Only)       Balance Overall balance assessment: Needs assistance Sitting-balance support: Feet supported;Single extremity supported Sitting balance-Leahy Scale: Good     Standing balance support: During functional activity Standing balance-Leahy Scale: Poor Standing balance comment: Relient on RW for support.                     Cognition Arousal/Alertness: Awake/alert Behavior During Therapy: WFL for tasks assessed/performed Overall Cognitive Status: No family/caregiver present to determine baseline cognitive functioning                      Exercises General Exercises - Lower Extremity Long Arc Quad: Both;10 reps;Seated Hip Flexion/Marching: Both;10 reps;Seated    General Comments General comments (skin integrity, edema, etc.): DOE 3/4.      Pertinent Vitals/Pain Pain Assessment: No/denies pain    Home Living                      Prior Function            PT Goals (current goals can now be found in the care plan section) Progress towards PT goals: Progressing toward goals    Frequency  Min 2X/week    PT Plan Current plan remains appropriate    Co-evaluation             End of Session Equipment Utilized During Treatment: Gait belt Activity Tolerance: Patient tolerated treatment well Patient left: in bed;with call  bell/phone within reach;with bed alarm set     Time: [phone] PT Time Calculation (min) (ACUTE ONLY): 23 min  Charges:  $Gait Training: 8-22 mins $Therapeutic Activity: 8-22 mins                    G Codes:      Sharada Albornoz A Britiny Defrain 10/12/2015, 10:23 AM Mylo RedShauna Keyanah Kozicki, PT, DPT [phone]

## 2015-10-12 NOTE — Clinical Social Work Placement (Signed)
   CLINICAL SOCIAL WORK PLACEMENT  NOTE  Date:  10/12/2015  Patient Details  Name: Justin Figueroa MRN: [id number] Date of Birth: [birth year]  Clinical Social Work is seeking post-discharge placement for this patient at the Skilled  Nursing Facility level of care (*CSW will initial, date and re-position this form in  chart as items are completed):  Yes   Patient/family provided with [place] Clinical Social Work Department's list of facilities offering this level of care within the geographic area requested by the patient (or if unable, by the patient's family).  Yes   Patient/family informed of their freedom to choose among providers that offer the needed level of care, that participate in Medicare, Medicaid or managed care program needed by the patient, have an available bed and are willing to accept the patient.  Yes   Patient/family informed of [facility]'s ownership interest in [facility] and [facility], as well as of the fact that they are under no obligation to receive care at these facilities.  PASRR submitted to EDS on       PASRR number received on       Existing PASRR number confirmed on 10/12/15     FL2 transmitted to all facilities in geographic area requested by pt/family on 10/11/15     FL2 transmitted to all facilities within larger geographic area on       Patient informed that his/her managed care company has contracts with or will negotiate with certain facilities, including the following:        Yes   Patient/family informed of bed offers received.  [name]     Physician recommends and patient chooses bed at      Patient to [name] on 10/12/15.  Patient to be transferred to facility by Ambulance     Patient family notified on 10/12/15 of transfer.  Name of family member notified:  [facility]     PHYSICIAN       Additional Comment:  Per MD patient [name].  RN, patient, patient's family, and facility notified of DC. RN given number for report. DC packet on chart. Ambulance transport will be requested by RN after PICC is placed. CSW signing off.   _______________________________________________ Roddie McBryant Yahayra Geis MSW, LCSW, [place], [phone]

## 2015-10-12 NOTE — Progress Notes (Addendum)
    [facility]   Reason for visit: Follow up on altered mental status  Interval History: neurology was unable to get fluid in LP, IR refuses to do LP due to medication.  No new issues.  No fever, PSA noted and minimally elevated.    Physical Exam: Constitutional:  Filed Vitals:   10/12/15 0426  BP: 102/61  Pulse: 82  Temp: 97.8 F (36.6 C)  Resp: 17   patient appears in NAD Eyes: anicteric HENT: no thrush Respiratory: Normal respiratory effort; CTA B Cardiovascular: RRR  Review of Systems: Constitutional: negative for fevers and chills Gastrointestinal: negative for diarrhea  Lab Results  Component Value Date   WBC 11.0* 10/12/2015   HGB 12.5* 10/12/2015   HCT 38.4* 10/12/2015   MCV 87.5 10/12/2015   PLT 411* 10/12/2015    Lab Results  Component Value Date   CREATININE 0.64 10/12/2015   BUN 13 10/12/2015   NA 140 10/12/2015   K 4.0 10/12/2015   CL 109 10/12/2015   CO2 24 10/12/2015    Lab Results  Component Value Date   ALT 30 10/09/2015   AST 72* 10/09/2015   ALKPHOS 59 10/09/2015     Microbiology: No results found for this or any previous visit (from the past 240 hour(s)).  Impression:  1. Possible neurosyphilis 2. Recent prostatitis, now asymptomatic  Plan: 1. 14 days of penicillin 4 million units every 4 hours 2. Stop antibiotics for prostatitis 3. picc line 4. After completion of IV penicillin, he will need 3 weekly doses of IM Bicillin 2.4 million units

## 2015-10-12 NOTE — Discharge Summary (Addendum)
Justin Figueroa, is a 81 y.o. male  DOB 1931/03/06  MRN [id number].  Admission date:  10/08/2015  Admitting Physician  Edsel PetrinMaryann Mikhail, DO  Discharge Date:  10/12/2015   Primary MD  [name],ASHISH, MD  Recommendations for primary care physician for things to follow:  - Patient to continue treatment for possible neurosyphilis, will need IV penicillin for 2 weeks, thereafter IM penicillin for 3 weeks - Check labs including CBC, BMP in 3 days. - Patient is on 2 L nasal cannula - please discontinue PICC line after IV antibiotic treatment is finished.  Admission Diagnosis  Cough [R05] Acute encephalopathy [G93.40]   Discharge Diagnosis  Cough [R05] Acute encephalopathy [G93.40]    Principal Problem:   Acute delirium Active Problems:   ST elevation (STEMI) myocardial infarction involving left anterior descending coronary artery [name](HCC)   Psychiatric illness   Ischemic cardiomyopathy   Hyperlipidemia   Prostatitis   Chest pain   Seizure [facility](HCC)      Past Medical History  Diagnosis Date  . CAD (coronary artery disease)     a. 07/2015 Ant STEMI/PCI: LM nl, LAD 70/100 (3.0x28 Promus DES), LCX 826m, RCA 50p. EF 35%.  . Ischemic cardiomyopathy     a. 07/2015 Echo: EF 25%, diff HK, mildly dil RV w/ mildly reduced fxn, mildly dil LA.  Marland Kitchen. Hyperlipidemia   . Altered mental status     Past Surgical History  Procedure Laterality Date  . Cardiac catheterization N/A 07/19/2015    Procedure: Left Heart Cath and Coronary Angiography;  Surgeon: Tonny BollmanMichael Cooper, MD;  [facility];  Service: Cardiovascular;  Laterality: N/A;       History of present illness and  Hospital Course:     Kindly see H&P for history of present illness and admission details, please review complete Labs, Consult reports and Test reports for all details in brief  HPI  from the history and physical done on the day of  admission 11/7  Justin Figueroa is a 81 y.o. male, with ischemic cardiomyopathy, recent anterior STEMI in August 2016, who was discharged [facility] on 11/4 after a lengthy stay for community-acquired pneumonia and prostatitis. Today he [facility] for acute encephalopathy and chest pain. History was gathered from the records, the patient's family, and the RN at [facility] as the patient is completely altered and unable to provide history. Previously the patient lived at home alone. He went [facility] where he was treated for dehydration, diarrhea, prostatitis, community acquired pneumonia. Friday, 11/4, he was [facility]. Per his nurse he did well over the weekend on IV antibiotics, calm and appropriate. This morning he was yelling obscenities and completely altered. Suddenly he displayed seizure-like activity which the RN described as assuming the fetal position with his hands over his ears and groaning for approximately 2-1/2 minutes. Afterward he fell asleep. Per his family (sister Corrie DandyMary) he was conversational on Saturday, but is prone to  having inappropriate fits and is hard of hearing. Further, [name] tells me the patient has a history of epilepsy but she does not know what antiepileptic medications he is on. I called [facility] he does not appear to have been on any antiepileptic medications according to the records.   In the emergency department at [facility] the patient is able to tell me he has chest pain. He calls me a liar when I reassure him that he is a [facility]. Otherwise his speech is unintelligible. He continuously moves and chatters throughout my exam and is very hard of hearing. WBC is 13.5, LFTs are slightly   Hospital Course   Acute encephalopathy - At baseline patient has some mild confusion . - CT head and MRI brain with no acute findings,TSH ammonia ,and B-12 WNL,    -started on low dose seroquel. - Significantly improved, most likely due to metabolic encephalopathy in the setting of infectious process (neurosyphilis) versus seizure  Possible neurosyphilis -   Treponema pallidum antibodies positive, RPR reactive, rapid plasma reagin 1;2, ID consult requested to evaluate for possible neuro syphilis ,- LP could not be done under fluoroscopy given patient is on Brilinta (could  not be stopped giving recent stent placement), so patient will be treated for possible neurosyphilis, to continue 14 days of penicillin 4 million units every 4 hours, and continue with IM Bicillin total of 3 weeks after IV penicillin is finished  Possible seizure -Nursing home RN describes what she thought was seizure-like activity. Sister states he has a history of epilepsy. Not on antiepileptic medications. -EEG w/o epileptiform activity -Consult greatly appreciated, started on Keppra.  Chest pain -In the setting of recent anterior STEMI in August 2016. -EKG does not show concerning ST changes. -will continue PPI, GI cocktail, continue home meds for recent ischemic cardiomyopathy including: Coreg, lisinopril, Brilinta and aspirin -flat troponin elevation and no CP ; most likely demand ischemia  Chronic systolic CHF - Recent echo in 08/1477 with EF 25%, continue with lisinopril, continue with Coreg,   Prostatitis -Being treated with Zosyn, now symptomatic  Recent community acquired pneumonia -Treated with Zosyn   Trush -will treat with nystatin        Discharge Condition:  Stable  Follow UP  Follow-up Information    Follow up with [facility], MD.   Specialty:  Internal Medicine   Why:  After discharge from SNF   Contact information:   [address] [place] [address] [phone]         Discharge Instructions  and  Discharge Medications     Discharge Instructions    Discharge instructions    Complete by:  As directed   Follow with Primary MD  Kirstie Peri, MD after discharge from SNF  Get CBC, CMP, 2 view Chest X ray checked  by Primary MD next visit.    Activity: As tolerated with Full fall precautions use walker/cane & assistance as needed   Disposition SNF   Diet: Heart Healthy , dysphagia 2 with thin liquid , with feeding assistance and aspiration precautions.  For Heart failure patients - Check your Weight same time everyday, if you gain over 2 pounds, or you develop in leg swelling, experience more shortness of breath or chest pain, call your Primary MD immediately. Follow Cardiac Low Salt Diet and 1.5 lit/day fluid restriction.   On your next visit with your primary care physician please Get Medicines reviewed and adjusted.   Please request your Prim.MD to go over all [facility]  Tests and Procedure/Radiological results at the follow up, please get all Hospital records sent to your Prim MD by signing hospital release before you go home.   If you experience worsening of your admission symptoms, develop shortness of breath, life threatening emergency, suicidal or homicidal thoughts you must seek medical attention immediately by calling 911 or calling your MD immediately  if symptoms less severe.  You Must read complete instructions/literature along with all the possible adverse reactions/side effects for all the Medicines you take and that have been prescribed to you. Take any new Medicines after you have completely understood and accpet all the possible adverse reactions/side effects.   Do not drive, operating heavy machinery, perform activities at heights, swimming or participation in water activities or provide baby sitting services if your were admitted for syncope or siezures until you have seen by Primary MD or a Neurologist and advised to do so again.  Do not drive when taking Pain medications.    Do not take more than prescribed Pain, Sleep and Anxiety Medications  Special Instructions: If you have smoked or  chewed Tobacco  in the last 2 yrs please stop smoking, stop any regular Alcohol  and or any Recreational drug use.  Wear Seat belts while driving.   Please note  You were cared for by a hospitalist during your hospital stay. If you have any questions about your discharge medications or the care you received while you were in the hospital after you are discharged, you can call the unit and asked to speak with the hospitalist on call if the hospitalist that took care of you is not available. Once you are discharged, your primary care physician will handle any further medical issues. Please note that NO REFILLS for any discharge medications will be authorized once you are discharged, as it is imperative that you return to your primary care physician (or establish a relationship with a primary care physician if you do not have one) for your aftercare needs so that they can reassess your need for medications and monitor your lab values.     Increase activity slowly    Complete by:  As directed             Medication List    STOP taking these medications        piperacillin-tazobactam 3.375 (3-0.375) G injection  Commonly known as:  ZOSYN      TAKE these medications        acetaminophen 500 MG tablet  Commonly known as:  TYLENOL  Take 500 mg by mouth every 6 (six) hours as needed for mild pain.     ALPRAZolam 0.5 MG tablet  Commonly known as:  XANAX  Take 0.5 mg by mouth every 6 (six) hours as needed for anxiety.     aspirin 81 MG EC tablet  Take 1 tablet (81 mg total) by mouth daily.     atorvastatin 80 MG tablet  Commonly known as:  LIPITOR  Take 80 mg by mouth daily.     calcium-vitamin D 500-200 MG-UNIT tablet  Commonly known as:  OSCAL WITH D  Take 1 tablet by mouth 2 (two) times daily.     carvedilol 3.125 MG tablet  Commonly known as:  COREG  Take 1 tablet (3.125 mg total) by mouth 2 (two) times daily with a meal.     cholecalciferol 1000 UNITS tablet  Commonly known  as:  VITAMIN D  Take 1,000 Units by mouth daily.  levETIRAcetam 500 MG tablet  Commonly known as:  KEPPRA  Take 1 tablet (500 mg total) by mouth 2 (two) times daily.     lisinopril 2.5 MG tablet  Commonly known as:  PRINIVIL,ZESTRIL  Take 1 tablet (2.5 mg total) by mouth every evening.     multivitamin with minerals tablet  Take 1 tablet by mouth daily.     nitroGLYCERIN 0.4 MG SL tablet  Commonly known as:  NITROSTAT  Place 1 tablet (0.4 mg total) under the tongue every 5 (five) minutes x 3 doses as needed for chest pain.     nystatin 100000 UNIT/ML suspension  Commonly known as:  MYCOSTATIN  Use as directed 5 mLs (500,000 Units total) in the mouth or throat 4 (four) times daily. Continue for next 7 days then stop     Penicillin G Benzathine 2400000 UNIT/4ML Susp  Commonly known as:  BICILLIN L-A  Please use weekly total of 3 weeks, this is to be started after IV penicillin is finished, first dose on November 25, second dose on December 2, third and last dose on December 9.     penicillin G potassium 4 Million Units in dextrose 5 % 250 mL  Inject 4 Million Units into the vein every 4 (four) hours. Continue taking for 2 weeks, stop on November 21.     potassium chloride SA 20 MEQ tablet  Commonly known as:  K-DUR,KLOR-CON  Take 1 tablet (20 mEq total) by mouth daily. For 7 days. Started on 10-05-15     QUEtiapine 25 MG tablet  Commonly known as:  SEROQUEL  Take 1 tablet (25 mg total) by mouth at bedtime.     senna-docusate 8.6-50 MG tablet  Commonly known as:  Senokot-S  Take 1 tablet by mouth at bedtime as needed for mild constipation.     spironolactone 25 MG tablet  Commonly known as:  ALDACTONE  Take 0.5 tablets (12.5 mg total) by mouth daily.     ticagrelor 90 MG Tabs tablet  Commonly known as:  BRILINTA  Take 1 tablet (90 mg total) by mouth 2 (two) times daily.          Diet and Activity recommendation: See Discharge Instructions above   Consults  obtained -  Neurology   infectious disease   Major procedures and Radiology Reports - PLEASE review detailed and final reports for all details, in brief -      Dg Chest 2 View  10/08/2015  CLINICAL DATA:  Two week history of cough EXAM: CHEST  2 VIEW COMPARISON:  October 03, 2015 FINDINGS: There is persistent airspace consolidation in the right lower lobe, slightly less compared to study from 5 days previously. There are persistent rather minimal pleural effusions bilaterally. There is atelectatic change in the left base which is stable. Heart is enlarged with pulmonary vascular within normal limits. There is a sizable paraesophageal type hernia. No adenopathy. Bones are somewhat osteoporotic. There is superior migration of both humeral heads consistent with chronic rotator cuff tears. IMPRESSION: Persistent right lower lobe airspace consolidation with pole minimally less opacity compared to 5 days previously. Atelectatic change left base. Minimal pleural effusions bilaterally. Stable cardiac prominence. Sizable paraesophageal hernia, stable. Evidence of chronic rotator cuff tears bilaterally. Electronically Signed   By: Bretta Bang III M.D.   On: 10/08/2015 12:45   Ct Head Wo Contrast  10/08/2015  CLINICAL DATA:  Acute encephalopathy, confusion EXAM: CT HEAD WITHOUT CONTRAST TECHNIQUE: Contiguous axial images were obtained from the  base of the skull through the vertex without intravenous contrast. COMPARISON:  09/28/2015 FINDINGS: Moderate diffuse age-related atrophy. Mild chronic white matter disease. Tiny stable lacunar infarct left thalamus. No evidence of vascular territory infarct hemorrhage mass or extra-axial fluid. No hydrocephalus. Minimal age-related basal ganglia calcification bilaterally. Left mastoid air cells are nearly completely opacified. Partial opacification right mastoid air cells. IMPRESSION: No evidence of acute hemorrhage or infarct. New opacification left mastoid air  cells, suggesting the presence of fluid which can be seen with mastoiditis. Electronically Signed   By: Esperanza Heir M.D.   On: 10/08/2015 15:36   Mr Brain Wo Contrast  10/09/2015  CLINICAL DATA:  Altered mental status EXAM: MRI HEAD WITHOUT CONTRAST TECHNIQUE: Multiplanar, multiecho pulse sequences of the brain and surrounding structures were obtained without intravenous contrast. COMPARISON:  CT head 10/08/2015 FINDINGS: Moderate generalized atrophy. Ventricular enlargement consistent with atrophy. Pituitary not enlarged. Negative for acute infarct.  No significant chronic ischemia. Negative for hemorrhage or mass.  No shift of the midline structures Mastoid sinus effusion bilaterally. Paranasal sinuses clear. Normal orbit. IMPRESSION: Generalized atrophy.  Negative for acute infarct Bilateral mastoid sinus effusion. Electronically Signed   By: Marlan Palau M.D.   On: 10/09/2015 20:56   Dg Swallowing Func-speech Pathology  10/09/2015  m Objective Swallowing Evaluation:   MBS Patient Details Name: DENVER BENTSON MRN: [id number] Date of Birth: [birth year] Today's Date: 10/09/2015 Time: SLP Start Time (ACUTE ONLY): 1028-SLP Stop Time (ACUTE ONLY): 1045 SLP Time Calculation (min) (ACUTE ONLY): 17 min Past Medical History: Past Medical History Diagnosis Date . CAD (coronary artery disease)    a. 07/2015 Ant STEMI/PCI: LM nl, LAD 70/100 (3.0x28 Promus DES), LCX 7m, RCA 50p. EF 35%. . Ischemic cardiomyopathy    a. 07/2015 Echo: EF 25%, diff HK, mildly dil RV w/ mildly reduced fxn, mildly dil LA. Marland Kitchen Hyperlipidemia  . Altered mental status  Past Surgical History: Past Surgical History Procedure Laterality Date . Cardiac catheterization N/A 07/19/2015   Procedure: Left Heart Cath and Coronary Angiography;  Surgeon: Tonny Bollman, MD;  Location: [facility] INVASIVE CV LAB;  Service: Cardiovascular;  Laterality: N/A; HPI: Other Pertinent Information: 81 year old male with a history of ischemic cardiopathy myopathy, coronary  disease, recent community-acquired pneumonia prostatitis admitted with acute encephalopathy and chest pain from a nursing facility. Per MD note patient being admitted for acute encephalopathy/delirium, suspected seizure activity. CXR persistent right lower lobe airspace consolidation with pole minimally less opacity compared to 5 days previously. CT no evidence of acute hemorrhage or infarct. BSE recommended MBS. No Data Recorded Assessment / Plan / Recommendation CHL IP CLINICAL IMPRESSIONS 10/09/2015 Therapy Diagnosis Moderate oral phase dysphagia;Mild pharyngeal phase dysphagia Clinical Impression Pt exhibited delayed oral manipulation and transit more so with regular texture and anterior spillage and anterior buccal retainment with thin barium. Mild senorimotor impairments led to delayed swallow to the pyriform sinuses and mild vallecular and pyriform residue (not sensed). Verbal and visual feedback for double swallow was effective in clearance. No penetration or aspiration observed however mildly increased risk due to increased work of breathing during meals. Recommend Dys 2 texture and thin liquids, no straws, pills whole in applesauce and double swallow.     CHL IP TREATMENT RECOMMENDATION 10/09/2015 Treatment Recommendations Therapy as outlined in treatment plan below   CHL IP DIET RECOMMENDATION 10/09/2015 SLP Diet Recommendations Dysphagia 2 (Fine chop);Thin Liquid Administration via (None) Medication Administration Whole meds with puree Compensations Slow rate;Small sips/bites;Check for pocketing Postural Changes and/or Swallow Maneuvers (None)  CHL IP OTHER RECOMMENDATIONS 10/09/2015 Recommended Consults (None) Oral Care Recommendations Oral care QID Other Recommendations (None)   No flowsheet data found.  CHL IP FREQUENCY AND DURATION 10/09/2015 Speech Therapy Frequency (ACUTE ONLY) min 2x/week Treatment Duration 2 weeks   Pertinent Vitals/Pain  SLP Swallow Goals No flowsheet data found. No flowsheet data  found.   CHL IP REASON FOR REFERRAL 10/09/2015 Reason for Referral Objectively evaluate swallowing function       No flowsheet data found.   [name] 10/09/2015, 11:14 AM [name] [name].Ed Personnel officer [phone]    Micro Results     No results found for this or any previous visit (from the past 240 hour(s)).     Today   Subjective:   Justin Mould today Afebrile, no chest pain, no shortness of breath. Denies any complaints Objective:   Blood pressure 101/50, pulse 84, temperature 98.1 F (36.7 C), temperature source Oral, resp. rate 18, height  (1.702 m), weight 59.8 kg (131 lb 13.4 oz), SpO2 95 %.   Intake/Output Summary (Last 24 hours) at 10/12/15 1411 Last data filed at 10/12/15 1356  Gross per 24 hour  Intake    400 ml  Output   1350 ml  Net   -950 ml    Exam  General: awake, communicative   Cardiovascular: S1 and S2, no rubs, no gallops   Respiratory: good air movement, no wheezing, no crackles  Abdomen: Soft, nontender, positive bowel sounds  Musculoskeletal: No edema, no cyanosis.  Data Review   CBC w Diff: Lab Results  Component Value Date   WBC 11.0* 10/12/2015   HGB 12.5* 10/12/2015   HCT 38.4* 10/12/2015   PLT 411* 10/12/2015   LYMPHOPCT 6 10/08/2015   MONOPCT 4 10/08/2015   EOSPCT 4 10/08/2015   BASOPCT 0 10/08/2015    CMP: Lab Results  Component Value Date   NA 140 10/12/2015   K 4.0 10/12/2015   CL 109 10/12/2015   CO2 24 10/12/2015   BUN 13 10/12/2015   CREATININE 0.64 10/12/2015   PROT 5.3* 10/09/2015   ALBUMIN 2.0* 10/09/2015   BILITOT 1.4* 10/09/2015   ALKPHOS 59 10/09/2015   AST 72* 10/09/2015   ALT 30 10/09/2015  .   Total Time in preparing paper work, data evaluation and todays exam - 35 minutes  Uzma Hellmer M.D on 10/12/2015 at 2:11 PM  [facility]   Office  [phone]

## 2015-10-12 NOTE — H&P (Signed)
Report called to Joelene MillinEvelyn Dodson,RN of the [facility] in [place].

## 2015-10-12 NOTE — Progress Notes (Signed)
Peripherally Inserted Central Catheter/Midline Placement  The IV Nurse has discussed with the patient and/or persons authorized to consent for the patient, the purpose of this procedure and the potential benefits and risks involved with this procedure.  The benefits include less needle sticks, lab draws from the catheter and patient may be discharged home with the catheter.  Risks include, but not limited to, infection, bleeding, blood clot (thrombus formation), and puncture of an artery; nerve damage and irregular heat beat.  Alternatives to this procedure were also discussed.  PICC/Midline Placement Documentation  PICC / Midline Single Lumen 10/12/15 PICC Right Basilic 41 cm 0 cm (Active)  Indication for Insertion or Continuance of Line Home intravenous therapies (PICC only) 10/12/2015  6:00 PM  Exposed Catheter (cm) 0 cm 10/12/2015  6:00 PM  Dressing Change Due 10/19/15 10/12/2015  6:00 PM       Stacie GlazeJoyce, Paco Cislo Horton 10/12/2015, 6:48 PM

## 2015-10-12 NOTE — Discharge Instructions (Signed)
Follow with Primary MD [name],ASHISH, MD after discharge from SNF  Get CBC, CMP, 2 view Chest X ray checked  by Primary MD next visit.    Activity: As tolerated with Full fall precautions use walker/cane & assistance as needed   Disposition SNF   Diet: Heart Healthy , dysphagia 2 with thin liquid , with feeding assistance and aspiration precautions.  For Heart failure patients - Check your Weight same time everyday, if you gain over 2 pounds, or you develop in leg swelling, experience more shortness of breath or chest pain, call your Primary MD immediately. Follow Cardiac Low Salt Diet and 1.5 lit/day fluid restriction.   On your next visit with your primary care physician please Get Medicines reviewed and adjusted.   Please request your Prim.MD to go over all Hospital Tests and Procedure/Radiological results at the follow up, please get all Hospital records sent to your Prim MD by signing hospital release before you go home.   If you experience worsening of your admission symptoms, develop shortness of breath, life threatening emergency, suicidal or homicidal thoughts you must seek medical attention immediately by calling 911 or calling your MD immediately  if symptoms less severe.  You Must read complete instructions/literature along with all the possible adverse reactions/side effects for all the Medicines you take and that have been prescribed to you. Take any new Medicines after you have completely understood and accpet all the possible adverse reactions/side effects.   Do not drive, operating heavy machinery, perform activities at heights, swimming or participation in water activities or provide baby sitting services if your were admitted for syncope or siezures until you have seen by Primary MD or a Neurologist and advised to do so again.  Do not drive when taking Pain medications.    Do not take more than prescribed Pain, Sleep and Anxiety Medications  Special Instructions: If  you have smoked or chewed Tobacco  in the last 2 yrs please stop smoking, stop any regular Alcohol  and or any Recreational drug use.  Wear Seat belts while driving.   Please note  You were cared for by a hospitalist during your hospital stay. If you have any questions about your discharge medications or the care you received while you were in the hospital after you are discharged, you can call the unit and asked to speak with the hospitalist on call if the hospitalist that took care of you is not available. Once you are discharged, your primary care physician will handle any further medical issues. Please note that NO REFILLS for any discharge medications will be authorized once you are discharged, as it is imperative that you return to your primary care physician (or establish a relationship with a primary care physician if you do not have one) for your aftercare needs so that they can reassess your need for medications and monitor your lab values.

## 2015-10-13 NOTE — Progress Notes (Signed)
Pt. D/c to [facility] [place]. We gave him a CHG bath before leaving.All of his belongings were sent with him. All questions were answered. No further needs at this time.  Dannya Pitkin H Rilan Eiland

## 2015-10-25 ENCOUNTER — Encounter (HOSPITAL_COMMUNITY): Payer: Self-pay | Admitting: *Deleted

## 2015-10-25 ENCOUNTER — Emergency Department (HOSPITAL_COMMUNITY): Payer: Medicare Other

## 2015-10-25 ENCOUNTER — Observation Stay (HOSPITAL_COMMUNITY)
Admission: EM | Admit: 2015-10-25 | Discharge: 2015-10-26 | Disposition: A | Discharge status: Home / Self Care | Payer: Medicare Other | Attending: Internal Medicine | Admitting: Internal Medicine

## 2015-10-25 DIAGNOSIS — Z9889 Other specified postprocedural states: Secondary | ICD-10-CM | POA: Diagnosis not present

## 2015-10-25 DIAGNOSIS — I5020 Unspecified systolic (congestive) heart failure: Secondary | ICD-10-CM

## 2015-10-25 DIAGNOSIS — A523 Neurosyphilis, unspecified: Secondary | ICD-10-CM

## 2015-10-25 DIAGNOSIS — R079 Chest pain, unspecified: Principal | ICD-10-CM

## 2015-10-25 DIAGNOSIS — Z8619 Personal history of other infectious and parasitic diseases: Secondary | ICD-10-CM | POA: Diagnosis not present

## 2015-10-25 DIAGNOSIS — E785 Hyperlipidemia, unspecified: Secondary | ICD-10-CM

## 2015-10-25 DIAGNOSIS — I2510 Atherosclerotic heart disease of native coronary artery without angina pectoris: Secondary | ICD-10-CM | POA: Insufficient documentation

## 2015-10-25 DIAGNOSIS — F0391 Unspecified dementia with behavioral disturbance: Secondary | ICD-10-CM | POA: Diagnosis not present

## 2015-10-25 DIAGNOSIS — Z7982 Long term (current) use of aspirin: Secondary | ICD-10-CM | POA: Insufficient documentation

## 2015-10-25 DIAGNOSIS — I255 Ischemic cardiomyopathy: Secondary | ICD-10-CM | POA: Diagnosis not present

## 2015-10-25 DIAGNOSIS — R29810 Facial weakness: Secondary | ICD-10-CM | POA: Diagnosis not present

## 2015-10-25 DIAGNOSIS — Z79899 Other long term (current) drug therapy: Secondary | ICD-10-CM | POA: Diagnosis not present

## 2015-10-25 DIAGNOSIS — F03918 Unspecified dementia, unspecified severity, with other behavioral disturbance: Secondary | ICD-10-CM

## 2015-10-25 DIAGNOSIS — R569 Unspecified convulsions: Secondary | ICD-10-CM

## 2015-10-25 HISTORY — DX: Neurosyphilis, unspecified: A52.3

## 2015-10-25 LAB — CBC WITH DIFFERENTIAL/PLATELET
BASOS ABS: 0 10*3/uL (ref 0.0–0.1)
BASOS PCT: 0 %
Eosinophils Absolute: 0.3 10*3/uL (ref 0.0–0.7)
Eosinophils Relative: 3 %
HEMATOCRIT: 39.4 % (ref 39.0–52.0)
HEMOGLOBIN: 12.5 g/dL — AB (ref 13.0–17.0)
LYMPHS PCT: 7 %
Lymphs Abs: 0.8 10*3/uL (ref 0.7–4.0)
MCH: 28.9 pg (ref 26.0–34.0)
MCHC: 31.7 g/dL (ref 30.0–36.0)
MCV: 91.2 fL (ref 78.0–100.0)
MONO ABS: 0.9 10*3/uL (ref 0.1–1.0)
MONOS PCT: 9 %
NEUTROS ABS: 8.5 10*3/uL — AB (ref 1.7–7.7)
NEUTROS PCT: 81 %
Platelets: 240 10*3/uL (ref 150–400)
RBC: 4.32 MIL/uL (ref 4.22–5.81)
RDW: 16.5 % — AB (ref 11.5–15.5)
WBC: 10.4 10*3/uL (ref 4.0–10.5)

## 2015-10-25 LAB — COMPREHENSIVE METABOLIC PANEL
ALBUMIN: 2.6 g/dL — AB (ref 3.5–5.0)
ALK PHOS: 116 U/L (ref 38–126)
ALT: 51 U/L (ref 17–63)
AST: 93 U/L — AB (ref 15–41)
Anion gap: 4 — ABNORMAL LOW (ref 5–15)
BILIRUBIN TOTAL: 1.5 mg/dL — AB (ref 0.3–1.2)
BUN: 12 mg/dL (ref 6–20)
CALCIUM: 8.4 mg/dL — AB (ref 8.9–10.3)
CO2: 31 mmol/L (ref 22–32)
CREATININE: 0.73 mg/dL (ref 0.61–1.24)
Chloride: 107 mmol/L (ref 101–111)
GFR calc Af Amer: >60 mL/min (ref >60)
GLUCOSE: 169 mg/dL — AB (ref 65–99)
Potassium: 3.9 mmol/L (ref 3.5–5.1)
Sodium: 142 mmol/L (ref 135–145)
TOTAL PROTEIN: 6.4 g/dL — AB (ref 6.5–8.1)

## 2015-10-25 LAB — TROPONIN I: Troponin I: <0.03 ng/mL (ref <0.031)

## 2015-10-25 LAB — PROTIME-INR
INR: 1.14 (ref 0.00–1.49)
PROTHROMBIN TIME: 14.8 s (ref 11.6–15.2)

## 2015-10-25 NOTE — ED Notes (Signed)
Pt arrived to er from [facility] for further evaluation of mid center chest pain with sob, nausea that started today, pt reports that the pain is intermittent, was given 325 aspirin and one sl nitro with no change in pain,

## 2015-10-25 NOTE — ED Notes (Signed)
caleed daughter, Justin Figueroa and informed that pt is here and being admitted.

## 2015-10-25 NOTE — ED Provider Notes (Signed)
CSN: [id number]     Arrival date & time 10/25/15  2007 History  By signing my name below, I, Justin Figueroa, attest that this documentation has been prepared under the direction and in the presence of Glynn Octave, MD. Electronically Signed: Murriel Figueroa, ED Scribe. 10/25/2015. 8:31 PM.     Chief Complaint  Patient presents with  . Chest Pain    LEVEL 5 CAVEAT for Dementia   Patient is a 81 y.o. male presenting with chest pain. The history is provided by the patient. No language interpreter was used.  Chest Pain  HPI Comments: Justin Figueroa is a 81 y.o. male with a hx of CAD, ischemic cardiomyopathy who presents to the Emergency Department complaining of intermittent generalized chest pain that radiates to his middle back with associated diaphoresis, nausea, SOB that has been present for three hours. Pt states he is having dull chest pain now, but reports he had more intense pain earlier. Pt states that the onset of his pain began with 5-6 minutes of constant, moderately severe chest pain, and reports his pain has come in intervals of a few minutes at a time since then. Pt was given 325 aspirin and one sl nitro prior to arrival.    Past Medical History  Diagnosis Date  . CAD (coronary artery disease)     a. 07/2015 Ant STEMI/PCI: LM nl, LAD 70/100 (3.0x28 Promus DES), LCX 26m, RCA 50p. EF 35%.  . Ischemic cardiomyopathy     a. 07/2015 Echo: EF 25%, diff HK, mildly dil RV w/ mildly reduced fxn, mildly dil LA.  Marland Kitchen Hyperlipidemia   . Altered mental status   . Neurosyphilis    Past Surgical History  Procedure Laterality Date  . Cardiac catheterization N/A 07/19/2015    Procedure: Left Heart Cath and Coronary Angiography;  Surgeon: Tonny Bollman, MD;  Location: [facility] INVASIVE CV LAB;  Service: Cardiovascular;  Laterality: N/A;   Family History  Problem Relation Age of Onset  . Heart disease Mother    Social History  Substance Use Topics  . Smoking status: Never Smoker   .  Smokeless tobacco: Never Used  . Alcohol Use: No    Review of Systems  Cardiovascular: Positive for chest pain.   A complete 10 system review of systems was obtained and all systems are negative except as noted in the HPI and PMH.     Allergies  Review of patient's allergies indicates no known allergies.  Home Medications   Prior to Admission medications   Medication Sig Start Date End Date Taking? Authorizing Provider  aspirin EC 81 MG EC tablet Take 1 tablet (81 mg total) by mouth daily. 07/23/15  Yes Brittainy Sherlynn Carbon, PA-C  atorvastatin (LIPITOR) 80 MG tablet Take 80 mg by mouth daily.   Yes Historical Provider, MD  calcium-vitamin D (OSCAL WITH D) 500-200 MG-UNIT tablet Take 1 tablet by mouth 2 (two) times daily.   Yes Historical Provider, MD  carvedilol (COREG) 3.125 MG tablet Take 1 tablet (3.125 mg total) by mouth 2 (two) times daily with a meal. 07/23/15  Yes Brittainy Sherlynn Carbon, PA-C  cholecalciferol (VITAMIN D) 1000 UNITS tablet Take 1,000 Units by mouth daily.   Yes Historical Provider, MD  levETIRAcetam (KEPPRA) 500 MG tablet Take 1 tablet (500 mg total) by mouth 2 (two) times daily. 10/12/15  Yes Leana Roe Elgergawy, MD  lisinopril (PRINIVIL,ZESTRIL) 2.5 MG tablet Take 1 tablet (2.5 mg total) by mouth every evening. 09/03/15  Yes Brittainy Sherlynn Carbon,  PA-C  Multiple Vitamins-Minerals (MULTIVITAMIN WITH MINERALS) tablet Take 1 tablet by mouth daily.   Yes Historical Provider, MD  penicillin G potassium (PFIZERPEN-G) [id number] UNITS injection Inject 4 Million Units into the muscle every 4 (four) hours. For prostitis until 10/28/15 started 10/13/15   Yes Historical Provider, MD  QUEtiapine (SEROQUEL) 25 MG tablet Take 1 tablet (25 mg total) by mouth at bedtime. 10/12/15  Yes Starleen Arms, MD  spironolactone (ALDACTONE) 25 MG tablet Take 0.5 tablets (12.5 mg total) by mouth daily. 07/23/15  Yes Brittainy Sherlynn Carbon, PA-C  ticagrelor (BRILINTA) 90 MG TABS tablet Take 1 tablet  (90 mg total) by mouth 2 (two) times daily. 07/23/15  Yes Brittainy Sherlynn Carbon, PA-C  acetaminophen (TYLENOL) 500 MG tablet Take 500 mg by mouth every 6 (six) hours as needed for mild pain.    Historical Provider, MD  ALPRAZolam Prudy Feeler) 0.5 MG tablet Take 1 tablet (0.5 mg total) by mouth 3 (three) times daily as needed for anxiety. Patient taking differently: Take 0.5 mg by mouth every 6 (six) hours as needed for anxiety.  10/12/15   Leana Roe Elgergawy, MD  nitroGLYCERIN (NITROSTAT) 0.4 MG SL tablet Place 1 tablet (0.4 mg total) under the tongue every 5 (five) minutes x 3 doses as needed for chest pain. 07/23/15   Brittainy Sherlynn Carbon, PA-C  Penicillin G Benzathine (BICILLIN L-A) 2400000 UNIT/4ML SUSP Please use weekly total of 3 weeks, this is to be started after IV penicillin is finished, first dose on November 25, second dose on December 2, third and last dose on December 9. Patient not taking: Reported on 10/25/2015 10/12/15   Leana Roe Elgergawy, MD  potassium chloride SA (K-DUR,KLOR-CON) 20 MEQ tablet Take 1 tablet (20 mEq total) by mouth daily. For 7 days. Started on 10-05-15 Patient not taking: Reported on 10/25/2015 10/12/15   Leana Roe Elgergawy, MD  senna-docusate (SENOKOT-S) 8.6-50 MG tablet Take 1 tablet by mouth at bedtime as needed for mild constipation. 10/12/15   Leana Roe Elgergawy, MD   BP 109/74 mmHg  Pulse 83  Temp(Src) 97.6 F (36.4 C) (Oral)  Resp 22  Wt 134 lb 3.2 oz (60.873 kg)  SpO2 99% Physical Exam  Constitutional: He appears well-developed and well-nourished. No distress.  HENT:  Head: Normocephalic and atraumatic.  Mouth/Throat: Oropharynx is clear and moist. No oropharyngeal exudate.  Eyes: Conjunctivae and EOM are normal. Pupils are equal, round, and reactive to light.  Neck: Normal range of motion. Neck supple.  No meningismus.  Cardiovascular: Normal rate, regular rhythm, normal heart sounds and intact distal pulses.   No murmur heard. Pulmonary/Chest: Effort  normal and breath sounds normal. No respiratory distress.  Lungs clear to auscultation bilaterally   Abdominal: Soft. There is no tenderness. There is no rebound and no guarding.  Musculoskeletal: Normal range of motion. He exhibits no edema or tenderness.  equal radial pulses Equal radial and DP pulses  Neurological: He exhibits normal muscle tone. Coordination normal.  Questionable right facial droop  Skin: Skin is warm.  Psychiatric: He has a normal mood and affect. His behavior is normal.  Nursing note and vitals reviewed.   ED Course  Procedures (including critical care time)  DIAGNOSTIC STUDIES: Oxygen Saturation is 100% on room air, normal by my interpretation.    COORDINATION OF CARE: 8:30 PM Discussed treatment plan with pt at bedside and pt agreed to plan.   Labs Review Labs Reviewed  CBC WITH DIFFERENTIAL/PLATELET - Abnormal; Notable for the following:  Hemoglobin 12.5 (*)    RDW 16.5 (*)    Neutro Abs 8.5 (*)    All other components within normal limits  COMPREHENSIVE METABOLIC PANEL - Abnormal; Notable for the following:    Glucose, Bld 169 (*)    Calcium 8.4 (*)    Total Protein 6.4 (*)    Albumin 2.6 (*)    AST 93 (*)    Total Bilirubin 1.5 (*)    Anion gap 4 (*)    All other components within normal limits  PROTIME-INR  TROPONIN I    Imaging Review Ct Head Wo Contrast  10/25/2015  CLINICAL DATA:  Initial evaluation for acute facial droop. EXAM: CT HEAD WITHOUT CONTRAST TECHNIQUE: Contiguous axial images were obtained from the base of the skull through the vertex without intravenous contrast. COMPARISON:  Prior MRI from 10/09/2015. FINDINGS: Generalized cerebral atrophy with chronic microvascular ischemic disease present. Scattered vascular calcifications within the carotid siphons. Remote lacunar infarct within the left thalamus. No acute large vessel territory infarct. No intracranial hemorrhage. No mass lesion, midline shift, or mass effect. No  hydrocephalus. No extra-axial fluid collection. Scalp soft tissues within normal limits. No acute abnormality about the orbits. Few scattered foci of soft tissue emphysema noted, like related to IV access. Paranasal sinuses are clear. Left mastoid effusion noted. Calvarium intact. IMPRESSION: 1. No acute intracranial process. 2. Age-related cerebral atrophy with chronic small vessel ischemic disease. 3. Left mastoid effusion. Electronically Signed   By: Rise Mu M.D.   On: 10/25/2015 21:08   Dg Chest Portable 1 View  10/25/2015  CLINICAL DATA:  Chest pain and nausea. EXAM: PORTABLE CHEST 1 VIEW COMPARISON:  October 08, 2015 FINDINGS: Central catheter tip is in the superior vena cava. No pneumothorax. There is a sizable paraesophageal type hernia. There is left base atelectasis. There is patchy consolidation in the medial right base, less than on recent prior study. Lungs elsewhere clear. Heart is enlarged with pulmonary vascularity within normal limits. No adenopathy. There is degenerative change in both shoulders with superior migration of both humeral heads. IMPRESSION: Patchy consolidation medial right base, less than on recent prior study. Persistent atelectatic change left base. No new opacity appreciable. There is a paraesophageal type hernia. There is stable cardiomegaly. Chronic rotator cuff tears present bilaterally. Central catheter tip in superior vena cava. No pneumothorax. Electronically Signed   By: Bretta Bang III M.D.   On: 10/25/2015 20:46   I have personally reviewed and evaluated these images and lab results as part of my medical decision-making.   EKG Interpretation   Date/Time:  Thursday October 25 2015 20:09:10 EST Ventricular Rate:  82 PR Interval:  197 QRS Duration: 84 QT Interval:  411 QTC Calculation: 480 R Axis:   -52 Text Interpretation:  Sinus rhythm Inferior infarct, old Anterior infarct,  age indeterminate anterior T wave inversions were present on  November 7  Confirmed by Manus Gunning  MD, Dequarius Jeffries [phone]) on 10/25/2015 8:16:47 PM      MDM   Final diagnoses:  Chest pain, unspecified chest pain type   patient from nursing home with episode of chest pain that started tonight. He is a poor historian. Describes pain that comes and goes across his central chest lasting about 15-20 minutes at a time. Recent admission for pneumonia and neurosyphilis. Patient with MI in August 2016 status post stent.  EKG shows T-wave inversions in leads V2 through V4 which is new but has been seen previously and his EKG. Troponin is negative.  LHC August 2016. FINAL CONCLUSIONS:  TOTAL OCCLUSION OF THE PROXIMAL LAD (ANTERIOR STEMI) TREATED SUCCESSFULLY WITH PRIMARY PCI USING A DRUG-ELUTING STENT  DIFFUSE NONOBSTRUCTIVE CAD INVOLVING THE LCX AND RCA  SEVERE SEGMENTAL LV SYSTOLIC DYSFUNCTION CONSISTENT WITH ANTERIOR INFARCT  Pain has improved in the ED.  Troponin negative.  ASA and NTG given. Doubt PE or dissection.  Some concern for recurrent ACS given recent MI. Admission d/w Dr. Konrad DoloresMerrell.  I personally performed the services described in this documentation, which was scribed in my presence. The recorded information has been reviewed and is accurate.    Glynn OctaveStephen Cyerra Yim, MD 10/26/15 [phone]

## 2015-10-26 ENCOUNTER — Encounter (HOSPITAL_COMMUNITY): Payer: Self-pay | Admitting: Family Medicine

## 2015-10-26 DIAGNOSIS — Z9889 Other specified postprocedural states: Secondary | ICD-10-CM | POA: Diagnosis not present

## 2015-10-26 DIAGNOSIS — A523 Neurosyphilis, unspecified: Secondary | ICD-10-CM

## 2015-10-26 DIAGNOSIS — R079 Chest pain, unspecified: Secondary | ICD-10-CM | POA: Diagnosis not present

## 2015-10-26 DIAGNOSIS — F0391 Unspecified dementia with behavioral disturbance: Secondary | ICD-10-CM | POA: Diagnosis not present

## 2015-10-26 DIAGNOSIS — I255 Ischemic cardiomyopathy: Secondary | ICD-10-CM | POA: Diagnosis not present

## 2015-10-26 DIAGNOSIS — Z8619 Personal history of other infectious and parasitic diseases: Secondary | ICD-10-CM | POA: Diagnosis not present

## 2015-10-26 DIAGNOSIS — Z7982 Long term (current) use of aspirin: Secondary | ICD-10-CM | POA: Diagnosis not present

## 2015-10-26 DIAGNOSIS — R29810 Facial weakness: Secondary | ICD-10-CM | POA: Diagnosis not present

## 2015-10-26 DIAGNOSIS — I2510 Atherosclerotic heart disease of native coronary artery without angina pectoris: Secondary | ICD-10-CM | POA: Diagnosis not present

## 2015-10-26 DIAGNOSIS — Z79899 Other long term (current) drug therapy: Secondary | ICD-10-CM | POA: Diagnosis not present

## 2015-10-26 DIAGNOSIS — F03918 Unspecified dementia, unspecified severity, with other behavioral disturbance: Secondary | ICD-10-CM

## 2015-10-26 DIAGNOSIS — E785 Hyperlipidemia, unspecified: Secondary | ICD-10-CM | POA: Diagnosis not present

## 2015-10-26 DIAGNOSIS — I5020 Unspecified systolic (congestive) heart failure: Secondary | ICD-10-CM | POA: Insufficient documentation

## 2015-10-26 LAB — TROPONIN I
Troponin I: 0.03 ng/mL (ref <0.031)
Troponin I: 0.03 ng/mL (ref <0.031)

## 2015-10-26 MED ORDER — SODIUM CHLORIDE 0.9 % IV SOLN
250.0000 mL | INTRAVENOUS | Status: DC | PRN
Start: 2015-10-26 — End: 2015-10-26

## 2015-10-26 MED ORDER — LEVETIRACETAM 500 MG PO TABS
500.0000 mg | ORAL_TABLET | Freq: Two times a day (BID) | ORAL | Status: DC
  Administered 2015-10-26 (×2): 500 mg via ORAL
  Filled 2015-10-26 (×2): qty 1

## 2015-10-26 MED ORDER — PENICILLIN G POTASSIUM 20000000 UNITS IJ SOLR
4.0000 10*6.[IU] | INTRAMUSCULAR | Status: DC
  Filled 2015-10-26 (×18): qty 4

## 2015-10-26 MED ORDER — GI COCKTAIL ~~LOC~~: 30.0000 mL | Freq: Four times a day (QID) | ORAL | Status: DC | PRN

## 2015-10-26 MED ORDER — QUETIAPINE FUMARATE 25 MG PO TABS
25.0000 mg | ORAL_TABLET | Freq: Every day | ORAL | Status: DC
  Administered 2015-10-26: 25 mg via ORAL
  Filled 2015-10-26: qty 1

## 2015-10-26 MED ORDER — SODIUM CHLORIDE 0.9 % IJ SOLN
3.0000 mL | Freq: Two times a day (BID) | INTRAMUSCULAR | Status: DC
  Administered 2015-10-26: 3 mL via INTRAVENOUS

## 2015-10-26 MED ORDER — SODIUM CHLORIDE 0.9 % IJ SOLN
3.0000 mL | INTRAMUSCULAR | Status: DC | PRN
  Administered 2015-10-26 (×2): 3 mL via INTRAVENOUS
  Filled 2015-10-26 (×2): qty 3

## 2015-10-26 MED ORDER — CARVEDILOL 3.125 MG PO TABS
3.1250 mg | ORAL_TABLET | Freq: Two times a day (BID) | ORAL | Status: DC
  Administered 2015-10-26 (×2): 3.125 mg via ORAL
  Filled 2015-10-26 (×2): qty 1

## 2015-10-26 MED ORDER — PENICILLIN G POTASSIUM 20000000 UNITS IJ SOLR
4.0000 10*6.[IU] | INTRAMUSCULAR | Status: DC
  Filled 2015-10-26 (×17): qty 4

## 2015-10-26 MED ORDER — SPIRONOLACTONE 25 MG PO TABS
12.5000 mg | ORAL_TABLET | Freq: Every day | ORAL | Status: DC
  Administered 2015-10-26: 12.5 mg via ORAL
  Filled 2015-10-26: qty 1

## 2015-10-26 MED ORDER — HEPARIN SODIUM (PORCINE) 5000 UNIT/ML IJ SOLN
5000.0000 [IU] | Freq: Three times a day (TID) | INTRAMUSCULAR | Status: DC
  Administered 2015-10-26 (×2): 5000 [IU] via SUBCUTANEOUS
  Filled 2015-10-26 (×2): qty 1

## 2015-10-26 MED ORDER — ACETAMINOPHEN 325 MG PO TABS: 650.0000 mg | ORAL_TABLET | ORAL | Status: DC | PRN

## 2015-10-26 MED ORDER — LISINOPRIL 5 MG PO TABS
2.5000 mg | ORAL_TABLET | Freq: Every evening | ORAL | Status: DC
  Administered 2015-10-26: 2.5 mg via ORAL
  Filled 2015-10-26: qty 1

## 2015-10-26 MED ORDER — ATORVASTATIN CALCIUM 40 MG PO TABS
80.0000 mg | ORAL_TABLET | Freq: Every day | ORAL | Status: DC
  Administered 2015-10-26: 80 mg via ORAL
  Filled 2015-10-26: qty 2

## 2015-10-26 MED ORDER — SENNOSIDES-DOCUSATE SODIUM 8.6-50 MG PO TABS: 1.0000 | ORAL_TABLET | Freq: Every evening | ORAL | Status: DC | PRN

## 2015-10-26 MED ORDER — TICAGRELOR 90 MG PO TABS
90.0000 mg | ORAL_TABLET | Freq: Two times a day (BID) | ORAL | Status: DC
  Administered 2015-10-26: 90 mg via ORAL
  Filled 2015-10-26 (×7): qty 1

## 2015-10-26 MED ORDER — ONDANSETRON HCL 4 MG/2ML IJ SOLN
4.0000 mg | Freq: Four times a day (QID) | INTRAMUSCULAR | Status: DC | PRN
Start: 2015-10-26 — End: 2015-10-26

## 2015-10-26 MED ORDER — ONDANSETRON HCL 4 MG PO TABS: 4.0000 mg | ORAL_TABLET | Freq: Four times a day (QID) | ORAL | Status: DC | PRN

## 2015-10-26 MED ORDER — ONDANSETRON HCL 4 MG/2ML IJ SOLN: 4.0000 mg | Freq: Four times a day (QID) | INTRAMUSCULAR | Status: DC | PRN

## 2015-10-26 MED ORDER — ASPIRIN EC 81 MG PO TBEC
81.0000 mg | DELAYED_RELEASE_TABLET | Freq: Every day | ORAL | Status: DC
  Administered 2015-10-26: 81 mg via ORAL
  Filled 2015-10-26: qty 1

## 2015-10-26 MED ORDER — PENICILLIN G POTASSIUM 5000000 UNITS IJ SOLR
4.0000 10*6.[IU] | INTRAVENOUS | Status: DC
  Administered 2015-10-26 (×4): 4 10*6.[IU] via INTRAVENOUS
  Filled 2015-10-26 (×18): qty 4

## 2015-10-26 MED ORDER — ALPRAZOLAM 0.5 MG PO TABS: 0.5000 mg | ORAL_TABLET | Freq: Four times a day (QID) | ORAL | Status: DC | PRN

## 2015-10-26 NOTE — Progress Notes (Signed)
Gave report to Tobin ChadEvelyn Dobson, LPN, from the [facility] on Mr. Justin Figueroa. Patient is stable and is having no pain.

## 2015-10-26 NOTE — Care Management Note (Signed)
Case Management Note  Patient Details  Name: Justin Figueroa MRN: [id number] Date of Birth: August 13, 1931  Subjective/Objective:                  [facility] with CP. Anticipate discharge back to facility at discharge.  Action/Plan: CSW is aware and will arrange discharge when medically stable.  Expected Discharge Date:                  Expected Discharge Plan:  Skilled Nursing Facility  In-House Referral:  Clinical Social Work  Discharge planning Services  CM Consult  Post Acute Care Choice:  NA Choice offered to:  NA  DME Arranged:    DME Agency:     HH Arranged:    HH Agency:     Status of Service:  Completed, signed off  Medicare Important Message Given:    Date Medicare IM Given:    Medicare IM give by:    Date Additional Medicare IM Given:    Additional Medicare Important Message give by:     If discussed at Long Length of Stay Meetings, dates discussed:    Additional Comments:  Cheryl FlashBlackwell, Tamario Heal Crowder, RN 10/26/2015, 11:55 AM

## 2015-10-26 NOTE — NC FL2 (Signed)
[place] MEDICAID FL2 LEVEL OF CARE SCREENING TOOL     IDENTIFICATION  Patient Name: Justin Figueroa Birthdate: July 06, 1931 Sex: male Admission Date (Current Location): 10/25/2015  [facility] and [place] Number:  Aaron Edelman) [id number] Facility and Address:  [facility],  [address] [place] [address]      Provider Number: [id number]  Attending Physician Name and Address:  Houston Siren, MD  Relative Name and Phone Number:   Melene Plan [phone])    Current Level of Care: Hospital Recommended Level of Care: Skilled Nursing Facility Prior Approval Number:    Date Approved/Denied:   PASRR Number: [id number]  Discharge Plan: Home    Current Diagnoses: Patient Active Problem List   Diagnosis Date Noted  . Dementia with behavioral disturbance 10/26/2015  . Neurosyphilis 10/26/2015  . Systolic congestive heart failure (HCC)   . Acute delirium 10/08/2015  . Prostatitis 10/08/2015  . Chest pain 10/08/2015  . Seizure (HCC) 10/08/2015  . Ischemic cardiomyopathy   . Hyperlipidemia   . Psychiatric illness 07/20/2015  . ST elevation (STEMI) myocardial infarction involving left anterior descending coronary artery (HCC) 07/19/2015  . Right arm pain   . STEMI (ST elevation myocardial infarction) (HCC)     Orientation ACTIVITIES/SOCIAL BLADDER RESPIRATION    Self, Time, Place, Situation  Active Continent Normal  BEHAVIORAL SYMPTOMS/MOOD NEUROLOGICAL BOWEL NUTRITION STATUS    Convulsions/Seizures Continent Diet  PHYSICIAN VISITS COMMUNICATION OF NEEDS Height & Weight Skin    Verbally  (170.2 cm) 134 lbs. Normal          AMBULATORY STATUS RESPIRATION    Assist independent Normal      Personal Care Assistance Level of Assistance  Bathing, Dressing Bathing Assistance: Limited assistance Feeding assistance: Limited assistance Dressing Assistance: Limited assistance      Functional Limitations Info  Hearing, Speech             SPECIAL  CARE FACTORS FREQUENCY                      Additional Factors Info                  Current Medications (10/26/2015): Current Facility-Administered Medications  Medication Dose Route Frequency Provider Last Rate Last Dose  . 0.9 %  sodium chloride infusion  250 mL Intravenous PRN Ozella Rocks, MD      . acetaminophen (TYLENOL) tablet 650 mg  650 mg Oral Q4H PRN Ozella Rocks, MD      . ALPRAZolam Prudy Feeler) tablet 0.5 mg  0.5 mg Oral Q6H PRN Ozella Rocks, MD      . aspirin EC tablet 81 mg  81 mg Oral Daily Ozella Rocks, MD   81 mg at 10/26/15 0272  . atorvastatin (LIPITOR) tablet 80 mg  80 mg Oral Daily Ozella Rocks, MD   80 mg at 10/26/15 0929  . carvedilol (COREG) tablet 3.125 mg  3.125 mg Oral BID WC Ozella Rocks, MD   3.125 mg at 10/26/15 0931  . gi cocktail (Maalox,Lidocaine,Donnatal)  30 mL Oral QID PRN Ozella Rocks, MD      . heparin injection 5,000 Units  5,000 Units Subcutaneous Q8H Ozella Rocks, MD   5,000 Units at 10/26/15 0930  . levETIRAcetam (KEPPRA) tablet 500 mg  500 mg Oral BID Ozella Rocks, MD   500 mg at 10/26/15 5366  . lisinopril (PRINIVIL,ZESTRIL) tablet 2.5 mg  2.5 mg Oral QPM Elmon Else  Konrad DoloresMerrell, MD      . ondansetron [facility](ZOFRAN) injection 4 mg  4 mg Intravenous Q6H PRN Ozella Rocksavid J Merrell, MD      . ondansetron [facility](ZOFRAN) tablet 4 mg  4 mg Oral Q6H PRN Ozella Rocksavid J Merrell, MD       Or  . ondansetron [facility](ZOFRAN) injection 4 mg  4 mg Intravenous Q6H PRN Ozella Rocksavid J Merrell, MD      . penicillin G potassium 4 Million Units in dextrose 5 % 250 mL IVPB  4 Million Units Intravenous Q4H Ozella Rocksavid J Merrell, MD   4 Million Units at 10/26/15 1526  . QUEtiapine (SEROQUEL) tablet 25 mg  25 mg Oral QHS Ozella Rocksavid J Merrell, MD   25 mg at 10/26/15 0130  . senna-docusate (Senokot-S) tablet 1 tablet  1 tablet Oral QHS PRN Ozella Rocksavid J Merrell, MD      . sodium chloride 0.9 % injection 3 mL  3 mL Intravenous Q12H Ozella Rocksavid J Merrell, MD   3 mL at 10/26/15 0130  . sodium chloride 0.9 %  injection 3 mL  3 mL Intravenous Q12H Ozella Rocksavid J Merrell, MD   3 mL at 10/26/15 0130  . sodium chloride 0.9 % injection 3 mL  3 mL Intravenous PRN Ozella Rocksavid J Merrell, MD   3 mL at 10/26/15 0941  . spironolactone (ALDACTONE) tablet 12.5 mg  12.5 mg Oral Daily Ozella Rocksavid J Merrell, MD   12.5 mg at 10/26/15 0929  . ticagrelor (BRILINTA) tablet 90 mg  90 mg Oral BID Ozella Rocksavid J Merrell, MD   90 mg at 10/26/15 [id number]   Do not use this list as official medication orders. Please verify with discharge summary.  Discharge Medications:   Medication List    STOP taking these medications        Penicillin G Benzathine 2400000 UNIT/4ML Susp  Commonly known as:  BICILLIN L-A     penicillin G potassium 4 Million Units in dextrose 5 % 250 mL      TAKE these medications        acetaminophen 500 MG tablet  Commonly known as:  TYLENOL  Take 500 mg by mouth every 6 (six) hours as needed for mild pain.     ALPRAZolam 0.5 MG tablet  Commonly known as:  XANAX  Take 1 tablet (0.5 mg total) by mouth 3 (three) times daily as needed for anxiety.     aspirin 81 MG EC tablet  Take 1 tablet (81 mg total) by mouth daily.     atorvastatin 80 MG tablet  Commonly known as:  LIPITOR  Take 80 mg by mouth daily.     calcium-vitamin D 500-200 MG-UNIT tablet  Commonly known as:  OSCAL WITH D  Take 1 tablet by mouth 2 (two) times daily.     carvedilol 3.125 MG tablet  Commonly known as:  COREG  Take 1 tablet (3.125 mg total) by mouth 2 (two) times daily with a meal.     cholecalciferol 1000 UNITS tablet  Commonly known as:  VITAMIN D  Take 1,000 Units by mouth daily.     levETIRAcetam 500 MG tablet  Commonly known as:  KEPPRA  Take 1 tablet (500 mg total) by mouth 2 (two) times daily.     lisinopril 2.5 MG tablet  Commonly known as:  PRINIVIL,ZESTRIL  Take 1 tablet (2.5 mg total) by mouth every evening.     multivitamin with minerals tablet  Take 1 tablet by mouth daily.     nitroGLYCERIN 0.4  MG SL tablet   Commonly known as:  NITROSTAT  Place 1 tablet (0.4 mg total) under the tongue every 5 (five) minutes x 3 doses as needed for chest pain.     PFIZERPEN-G [id number] UNITS injection  Generic drug:  penicillin G potassium  Inject 4 Million Units into the muscle every 4 (four) hours. For prostitis until 10/28/15 started 10/13/15     potassium chloride SA 20 MEQ tablet  Commonly known as:  K-DUR,KLOR-CON  Take 1 tablet (20 mEq total) by mouth daily. For 7 days. Started on 10-05-15     QUEtiapine 25 MG tablet  Commonly known as:  SEROQUEL  Take 1 tablet (25 mg total) by mouth at bedtime.     senna-docusate 8.6-50 MG tablet  Commonly known as:  Senokot-S  Take 1 tablet by mouth at bedtime as needed for mild constipation.     spironolactone 25 MG tablet  Commonly known as:  ALDACTONE  Take 0.5 tablets (12.5 mg total) by mouth daily.     ticagrelor 90 MG Tabs tablet  Commonly known as:  BRILINTA  Take 1 tablet (90 mg total) by mouth 2 (two) times daily.           Settle, Juleen China, LCSW

## 2015-10-26 NOTE — Progress Notes (Signed)
EMS arrived to take back to nursing home.  PICC line was not able to be removed at this time.  Notified nursing home.  Patient in stable condition.  Patient discharged back to nursing home.

## 2015-10-26 NOTE — Clinical Social Work Note (Signed)
Clinical Social Work Assessment  Patient Details  Name: Justin Figueroa MRN: [id number] Date of Birth: 1931-04-22  Date of referral:  10/26/15               Reason for consult:  Facility Placement                Permission sought to share information with:    Permission granted to share information::     Name::        Agency::     Relationship::     Contact Information:     Housing/Transportation Living arrangements for the past 2 months:  Single Family Home, Skilled Nursing Facility Source of Information:   (Sister Melene PlanMary Wadel) Patient Interpreter Needed:  None Criminal Activity/Legal Involvement Pertinent to Current Situation/Hospitalization:  No - Comment as needed Significant Relationships:  Siblings Lives with:  Self, Facility Resident Do you feel safe going back to the place where you live?  Yes Need for family participation in patient care:  Yes (Comment)  Care giving concerns:  Facility resident.    Social Worker assessment / plan:  Patient's sister, Corrie DandyMary, reported that patient was a resident [facility] for the past three weeks.  She stated that at baseline patient patient ambulates with a cane.  She stated that staff at the facility assist him with ADL's. She stated that while she would prefer for [facility]. CSW advised that patient was being discharged today and would be returning to the facility.  She stated that patient's ultimate goal is to return to his home but she is unsure as to whether he can live alone.  CSW spoke with [facility]ris at [facility]. He confirmed information provided by patient's sister and that patient could return at discharge. CSW advised that patient was being discharged today.  CSW facilitated discharge.  CSW advised patient that [facility] would bill his insurance, but the insurance may not pay as due to patient's abilities.  Patient stated that he came by ambulance and he would be returned by  ambulance. Thayer OhmChris at [facility] advised that their transportation person was off today.    Employment status:  Retired Health and safety inspectornsurance information:  Medicare PT Recommendations:  Not assessed at this time Information / Referral to community resources:     Patient/Family's Response to care:  Patient is agreeable to return to SNF but states that he wants to be out by 11/03/15 because he has bills to pay.   Patient/Family's Understanding of and Emotional Response to Diagnosis, Current Treatment, and Prognosis:  Patient understands his current diagnosis, treatment and prognosis.    Emotional Assessment Appearance:  Appears stated age Attitude/Demeanor/Rapport:  Unable to Assess Affect (typically observed):  Unable to Assess Orientation:    Alcohol / Substance use:  Not Applicable Psych involvement (Current and /or in the community):  No (Comment)  Discharge Needs  Concerns to be addressed:  Discharge Planning Concerns Readmission within the last 30 days:  Yes Current discharge risk:  None Barriers to Discharge:  No Barriers Identified   Annice NeedySettle, Burnard Enis D, LCSW 10/26/2015, 3:37 PM [phone]

## 2015-10-26 NOTE — Progress Notes (Signed)
Patient has PICC line. [facility] nurse requested for it to be removed prior to discharge. Dr. Conley RollsLe notified. New order for PICC to be removed.

## 2015-10-26 NOTE — H&P (Signed)
[facility] History and Physical  [name]:[id number] DOB: 1931/06/21 DOA: 10/25/2015  Referring physician: Dr Manus Gunning - [facility] PCP: Kirstie Peri, MD   Chief Complaint: CP  HPI: Justin Figueroa is a 81 y.o. male  Chest pain. Acute onset area did substernal to right sided chest. Associated with diaphoresis, nausea, shortness of breath. Lasted approximately 3 hours initially and then resolved but continues to have waxing and waning nature with persistent ache. Additionally radiates occasionally to middle of back. Reports that this is similar to his previous chest pain when he was diagnosed with a heart attack. Improved after given 325 aspirin and nitroglycerin.  Of note patient with dementia and struggles to give history. Level V caveat applies.  Review of Systems:  Unable to obtain further review of systems due to patient's mental status  Past Medical History  Diagnosis Date  . CAD (coronary artery disease)     a. 07/2015 Ant STEMI/PCI: LM nl, LAD 70/100 (3.0x28 Promus DES), LCX 100m, RCA 50p. EF 35%.  . Ischemic cardiomyopathy     a. 07/2015 Echo: EF 25%, diff HK, mildly dil RV w/ mildly reduced fxn, mildly dil LA.  Marland Kitchen Hyperlipidemia   . Altered mental status   . Neurosyphilis    Past Surgical History  Procedure Laterality Date  . Cardiac catheterization N/A 07/19/2015    Procedure: Left Heart Cath and Coronary Angiography;  Surgeon: Tonny Bollman, MD;  Location: [facility] INVASIVE CV LAB;  Service: Cardiovascular;  Laterality: N/A;   Social History:  reports that he has never smoked. He has never used smokeless tobacco. He reports that he does not drink alcohol or use illicit drugs.  No Known Allergies  Family History  Problem Relation Age of Onset  . Heart disease Mother      Prior to Admission medications   Medication Sig Start Date End Date Taking? Authorizing Provider  aspirin EC 81 MG EC tablet Take 1 tablet (81 mg total) by mouth daily. 07/23/15  Yes  Brittainy Sherlynn Carbon, PA-C  atorvastatin (LIPITOR) 80 MG tablet Take 80 mg by mouth daily.   Yes Historical Provider, MD  calcium-vitamin D (OSCAL WITH D) 500-200 MG-UNIT tablet Take 1 tablet by mouth 2 (two) times daily.   Yes Historical Provider, MD  carvedilol (COREG) 3.125 MG tablet Take 1 tablet (3.125 mg total) by mouth 2 (two) times daily with a meal. 07/23/15  Yes Brittainy Sherlynn Carbon, PA-C  cholecalciferol (VITAMIN D) 1000 UNITS tablet Take 1,000 Units by mouth daily.   Yes Historical Provider, MD  levETIRAcetam (KEPPRA) 500 MG tablet Take 1 tablet (500 mg total) by mouth 2 (two) times daily. 10/12/15  Yes Leana Roe Elgergawy, MD  lisinopril (PRINIVIL,ZESTRIL) 2.5 MG tablet Take 1 tablet (2.5 mg total) by mouth every evening. 09/03/15  Yes Brittainy Sherlynn Carbon, PA-C  Multiple Vitamins-Minerals (MULTIVITAMIN WITH MINERALS) tablet Take 1 tablet by mouth daily.   Yes Historical Provider, MD  penicillin G potassium (PFIZERPEN-G) 81191478 UNITS injection Inject 4 Million Units into the muscle every 4 (four) hours. For prostitis until 10/28/15 started 10/13/15   Yes Historical Provider, MD  QUEtiapine (SEROQUEL) 25 MG tablet Take 1 tablet (25 mg total) by mouth at bedtime. 10/12/15  Yes Starleen Arms, MD  spironolactone (ALDACTONE) 25 MG tablet Take 0.5 tablets (12.5 mg total) by mouth daily. 07/23/15  Yes Brittainy Sherlynn Carbon, PA-C  ticagrelor (BRILINTA) 90 MG TABS tablet Take 1 tablet (90 mg total) by mouth 2 (two) times daily. 07/23/15  Yes Brittainy Sherlynn CarbonM Simmons, PA-C  acetaminophen (TYLENOL) 500 MG tablet Take 500 mg by mouth every 6 (six) hours as needed for mild pain.    Historical Provider, MD  ALPRAZolam Prudy Feeler(XANAX) 0.5 MG tablet Take 1 tablet (0.5 mg total) by mouth 3 (three) times daily as needed for anxiety. Patient taking differently: Take 0.5 mg by mouth every 6 (six) hours as needed for anxiety.  10/12/15   Leana Roeawood S Elgergawy, MD  nitroGLYCERIN (NITROSTAT) 0.4 MG SL tablet Place 1 tablet (0.4  mg total) under the tongue every 5 (five) minutes x 3 doses as needed for chest pain. 07/23/15   Brittainy Sherlynn CarbonM Simmons, PA-C  Penicillin G Benzathine (BICILLIN L-A) 2400000 UNIT/4ML SUSP Please use weekly total of 3 weeks, this is to be started after IV penicillin is finished, first dose on November 25, second dose on December 2, third and last dose on December 9. Patient not taking: Reported on 10/25/2015 10/12/15   Leana Roeawood S Elgergawy, MD  potassium chloride SA (K-DUR,KLOR-CON) 20 MEQ tablet Take 1 tablet (20 mEq total) by mouth daily. For 7 days. Started on 10-05-15 Patient not taking: Reported on 10/25/2015 10/12/15   Leana Roeawood S Elgergawy, MD  senna-docusate (SENOKOT-S) 8.6-50 MG tablet Take 1 tablet by mouth at bedtime as needed for mild constipation. 10/12/15   Starleen Armsawood S Elgergawy, MD   Physical Exam: Filed Vitals:   10/25/15 2008 10/25/15 2200 10/25/15 2301  BP: 108/61 107/95 109/74  Pulse: 81 83 83  Temp: 97.5 F (36.4 C)  97.6 F (36.4 C)  TempSrc: Oral  Oral  Resp: 20 23 22   Weight:   60.873 kg (134 lb 3.2 oz)  SpO2: 100% 94% 99%    Wt Readings from Last 3 Encounters:  10/25/15 60.873 kg (134 lb 3.2 oz)  10/12/15 59.8 kg (131 lb 13.4 oz)  09/18/15 64.411 kg (142 lb)    General:  Appears calm and comfortable Eyes:  PERRL, EOMI, normal lids, iris ENT: Dry mucous membranes Neck: no LAD, masses or thyromegaly Cardiovascular:  RRR, 2/6 systolic murmur. No LE edema.  Respiratory:  CTA bilaterally, no w/r/r. Normal respiratory effort. Abdomen:  soft, ntnd Skin:  no rash or induration seen on limited exam Musculoskeletal: Globally weak tone in upper and lower extremities but symmetric Psychiatric: Alert and oriented to person. Intermittently answers questions appropriately but is very tangential in thought process Neurologic:  CN 2-12 grossly intact, moves all extremities in coordinated fashion.          Labs on Admission:  Basic Metabolic Panel:  Recent Labs Lab 10/25/15 2022   NA 142  K 3.9  CL 107  CO2 31  GLUCOSE 169*  BUN 12  CREATININE 0.73  CALCIUM 8.4*   Liver Function Tests:  Recent Labs Lab 10/25/15 2022  AST 93*  ALT 51  ALKPHOS 116  BILITOT 1.5*  PROT 6.4*  ALBUMIN 2.6*   No results for input(s): LIPASE, AMYLASE in the last 168 hours. No results for input(s): AMMONIA in the last 168 hours. CBC:  Recent Labs Lab 10/25/15 2022  WBC 10.4  NEUTROABS 8.5*  HGB 12.5*  HCT 39.4  MCV 91.2  PLT 240   Cardiac Enzymes:  Recent Labs Lab 10/25/15 2022  TROPONINI <0.03    BNP (last 3 results)  Recent Labs  07/19/15 1630 07/20/15 0309  BNP 189.4* 682.1*    ProBNP (last 3 results) No results for input(s): PROBNP in the last 8760 hours.   CREATININE: 0.73 (10/25/15 2022) Estimated  creatinine clearance - 61.3 mL/min  CBG: No results for input(s): GLUCAP in the last 168 hours.  Radiological Exams on Admission: Ct Head Wo Contrast  10/25/2015  CLINICAL DATA:  Initial evaluation for acute facial droop. EXAM: CT HEAD WITHOUT CONTRAST TECHNIQUE: Contiguous axial images were obtained from the base of the skull through the vertex without intravenous contrast. COMPARISON:  Prior MRI from 10/09/2015. FINDINGS: Generalized cerebral atrophy with chronic microvascular ischemic disease present. Scattered vascular calcifications within the carotid siphons. Remote lacunar infarct within the left thalamus. No acute large vessel territory infarct. No intracranial hemorrhage. No mass lesion, midline shift, or mass effect. No hydrocephalus. No extra-axial fluid collection. Scalp soft tissues within normal limits. No acute abnormality about the orbits. Few scattered foci of soft tissue emphysema noted, like related to IV access. Paranasal sinuses are clear. Left mastoid effusion noted. Calvarium intact. IMPRESSION: 1. No acute intracranial process. 2. Age-related cerebral atrophy with chronic small vessel ischemic disease. 3. Left mastoid effusion.  Electronically Signed   By: Rise Mu M.D.   On: 10/25/2015 21:08   Dg Chest Portable 1 View  10/25/2015  CLINICAL DATA:  Chest pain and nausea. EXAM: PORTABLE CHEST 1 VIEW COMPARISON:  October 08, 2015 FINDINGS: Central catheter tip is in the superior vena cava. No pneumothorax. There is a sizable paraesophageal type hernia. There is left base atelectasis. There is patchy consolidation in the medial right base, less than on recent prior study. Lungs elsewhere clear. Heart is enlarged with pulmonary vascularity within normal limits. No adenopathy. There is degenerative change in both shoulders with superior migration of both humeral heads. IMPRESSION: Patchy consolidation medial right base, less than on recent prior study. Persistent atelectatic change left base. No new opacity appreciable. There is a paraesophageal type hernia. There is stable cardiomegaly. Chronic rotator cuff tears present bilaterally. Central catheter tip in superior vena cava. No pneumothorax. Electronically Signed   By: Bretta Bang III M.D.   On: 10/25/2015 20:46     Assessment/Plan Principal Problem:   Chest pain Active Problems:   Ischemic cardiomyopathy   Hyperlipidemia   Seizure (HCC)   Dementia with behavioral disturbance   Neurosyphilis  Chest pain: History of coronary artery disease and ischemic cardiomyopathy in August 2016 resulting in cardiac catheterization in stent placement. Patient's description of symptoms worrisome for recurrent ACS. Troponin negative. EKG without evidence of ACS despite T-wave changes as these are noticed previously. May have mild underlying MSK component as chest pain is somewhat reproducible on palpation - Telemetry - Cycle troponins - EKG in a.m. - GI cocktail - cont ASA, Brilinta  Neurosyphilis: Right PICC in place. Currently undergoing penicillin treatment. - Continue penicillin  Possible Seizures: associated w/ neurosyphilis. Recently started on Keppra. No  further reported szrs.  - cont keppra  Chronic congestive heart failure: EF25%. No sign of acute decompensation - Continue spray lactone, Lasix, beta blocker, ACE inhibitor    HLD: - continue statin  Dementia: at baseline - continue seroquel  Code Status: FULL  DVT Prophylaxis: Hep Family Communication: None Disposition Plan: Pending Improvement    Izmael Duross Shela Commons, MD Family Medicine [facility] [email or web address] Password [email or web address]

## 2015-10-26 NOTE — Care Management Obs Status (Signed)
MEDICARE OBSERVATION STATUS NOTIFICATION   Patient Details  Name: Justin Figueroa MRN: [id number] Date of Birth: October 06, 1931   Medicare Observation Status Notification Given:  Yes    Cheryl FlashBlackwell, Tana Trefry Crowder, RN 10/26/2015, 11:54 AM

## 2015-10-26 NOTE — NC FL2 (Deleted)
[place] MEDICAID FL2 LEVEL OF CARE SCREENING TOOL     IDENTIFICATION  Patient Name: Justin Figueroa Birthdate: 22-Oct-1931 Sex: male Admission Date (Current Location): 10/25/2015  [facility] [place] [place]:  Aaron Edelman([place]) [id number] Facility and Address:  [facility],  [address], [place] [id number]      Provider Number: [id number]  Attending Physician Name and Address:  Houston SirenPeter Kelsi Benham, MD  Relative Name and Phone Number:   Melene Plan([name] Wadel [phone])    Current Level of Care: Hospital Recommended Level of Care: Skilled Nursing Facility Prior Approval Number:    Date Approved/Denied:   PASRR Number: [id number]  Discharge Plan: Home    Current Diagnoses: Patient Active Problem List   Diagnosis Date Noted  . Dementia with behavioral disturbance 10/26/2015  . Neurosyphilis 10/26/2015  . Systolic congestive heart failure (HCC)   . Acute delirium 10/08/2015  . Prostatitis 10/08/2015  . Chest pain 10/08/2015  . Seizure (HCC) 10/08/2015  . Ischemic cardiomyopathy   . Hyperlipidemia   . Psychiatric illness 07/20/2015  . ST elevation (STEMI) myocardial infarction involving left anterior descending coronary artery (HCC) 07/19/2015  . Right arm pain   . STEMI (ST elevation myocardial infarction) (HCC)     Orientation ACTIVITIES/SOCIAL BLADDER RESPIRATION    Self  Active Continent Normal  BEHAVIORAL SYMPTOMS/MOOD NEUROLOGICAL BOWEL NUTRITION STATUS    Convulsions/Seizures Continent Diet  PHYSICIAN VISITS COMMUNICATION OF NEEDS Height & Weight Skin    Verbally 5\' 7"  (170.2 cm) 134 lbs. Normal          AMBULATORY STATUS RESPIRATION    Assist independent Normal      Personal Care Assistance Level of Assistance  Bathing, Dressing Bathing Assistance: Maximum assistance Feeding assistance: Limited assistance Dressing Assistance: Maximum assistance      Functional Limitations Info  Hearing, Speech             SPECIAL CARE FACTORS FREQUENCY                      Additional Factors Info                  Current Medications (10/26/2015): Current Facility-Administered Medications  Medication Dose Route Frequency Provider Last Rate Last Dose  . 0.9 %  sodium chloride infusion  250 mL Intravenous PRN Ozella Rocksavid J Merrell, MD      . acetaminophen (TYLENOL) tablet 650 mg  650 mg Oral Q4H PRN Ozella Rocksavid J Merrell, MD      . ALPRAZolam Prudy Feeler(XANAX) tablet 0.5 mg  0.5 mg Oral Q6H PRN Ozella Rocksavid J Merrell, MD      . aspirin EC tablet 81 mg  81 mg Oral Daily Ozella Rocksavid J Merrell, MD   81 mg at 10/26/15 [id number]  . atorvastatin (LIPITOR) tablet 80 mg  80 mg Oral Daily Ozella Rocksavid J Merrell, MD   80 mg at 10/26/15 0929  . carvedilol (COREG) tablet 3.125 mg  3.125 mg Oral BID WC Ozella Rocksavid J Merrell, MD   3.125 mg at 10/26/15 0931  . gi cocktail (Maalox,Lidocaine,Donnatal)  30 mL Oral QID PRN Ozella Rocksavid J Merrell, MD      . heparin injection 5,000 Units  5,000 Units Subcutaneous Q8H Ozella Rocksavid J Merrell, MD   5,000 Units at 10/26/15 0930  . levETIRAcetam (KEPPRA) tablet 500 mg  500 mg Oral BID Ozella Rocksavid J Merrell, MD   500 mg at 10/26/15 [id number]  . lisinopril (PRINIVIL,ZESTRIL) tablet 2.5 mg  2.5 mg Oral QPM Ozella Rocksavid J Merrell, MD      .  ondansetron (ZOFRAN) injection 4 mg  4 mg Intravenous Q6H PRN Ozella Rocks, MD      . ondansetron [facility]) tablet 4 mg  4 mg Oral Q6H PRN Ozella Rocks, MD       Or  . ondansetron [facility]) injection 4 mg  4 mg Intravenous Q6H PRN Ozella Rocks, MD      . penicillin G potassium 4 Million Units in dextrose 5 % 250 mL IVPB  4 Million Units Intravenous Q4H Ozella Rocks, MD   4 Million Units at 10/26/15 1526  . QUEtiapine (SEROQUEL) tablet 25 mg  25 mg Oral QHS Ozella Rocks, MD   25 mg at 10/26/15 0130  . senna-docusate (Senokot-S) tablet 1 tablet  1 tablet Oral QHS PRN Ozella Rocks, MD      . sodium chloride 0.9 % injection 3 mL  3 mL Intravenous Q12H Ozella Rocks, MD   3 mL at 10/26/15 0130  . sodium chloride 0.9 % injection 3 mL  3 mL  Intravenous Q12H Ozella Rocks, MD   3 mL at 10/26/15 0130  . sodium chloride 0.9 % injection 3 mL  3 mL Intravenous PRN Ozella Rocks, MD   3 mL at 10/26/15 0941  . spironolactone (ALDACTONE) tablet 12.5 mg  12.5 mg Oral Daily Ozella Rocks, MD   12.5 mg at 10/26/15 0929  . ticagrelor (BRILINTA) tablet 90 mg  90 mg Oral BID Ozella Rocks, MD   90 mg at 10/26/15 9604   Do not use this list as official medication orders. Please verify with discharge summary.  Discharge Medications:   Medication List    ASK your doctor about these medications        acetaminophen 500 MG tablet  Commonly known as:  TYLENOL  Take 500 mg by mouth every 6 (six) hours as needed for mild pain.     ALPRAZolam 0.5 MG tablet  Commonly known as:  XANAX  Take 1 tablet (0.5 mg total) by mouth 3 (three) times daily as needed for anxiety.     aspirin 81 MG EC tablet  Take 1 tablet (81 mg total) by mouth daily.     atorvastatin 80 MG tablet  Commonly known as:  LIPITOR  Take 80 mg by mouth daily.     calcium-vitamin D 500-200 MG-UNIT tablet  Commonly known as:  OSCAL WITH D  Take 1 tablet by mouth 2 (two) times daily.     carvedilol 3.125 MG tablet  Commonly known as:  COREG  Take 1 tablet (3.125 mg total) by mouth 2 (two) times daily with a meal.     cholecalciferol 1000 UNITS tablet  Commonly known as:  VITAMIN D  Take 1,000 Units by mouth daily.     levETIRAcetam 500 MG tablet  Commonly known as:  KEPPRA  Take 1 tablet (500 mg total) by mouth 2 (two) times daily.     lisinopril 2.5 MG tablet  Commonly known as:  PRINIVIL,ZESTRIL  Take 1 tablet (2.5 mg total) by mouth every evening.     multivitamin with minerals tablet  Take 1 tablet by mouth daily.     nitroGLYCERIN 0.4 MG SL tablet  Commonly known as:  NITROSTAT  Place 1 tablet (0.4 mg total) under the tongue every 5 (five) minutes x 3 doses as needed for chest pain.     Penicillin G Benzathine 2400000 UNIT/4ML Susp  Commonly known  as:  BICILLIN L-A  Please use weekly total of 3 weeks, this is to be started after IV penicillin is finished, first dose on November 25, second dose on December 2, third and last dose on December 9.     penicillin G potassium 4 Million Units in dextrose 5 % 250 mL  Inject 4 Million Units into the vein every 4 (four) hours. Continue taking for 2 weeks, stop on November 21.  Ask about: Should I take this medication?     PFIZERPEN-G [id number] UNITS injection  Generic drug:  penicillin G potassium  Inject 4 Million Units into the muscle every 4 (four) hours. For prostitis until 10/28/15 started 10/13/15     potassium chloride SA 20 MEQ tablet  Commonly known as:  K-DUR,KLOR-CON  Take 1 tablet (20 mEq total) by mouth daily. For 7 days. Started on 10-05-15     QUEtiapine 25 MG tablet  Commonly known as:  SEROQUEL  Take 1 tablet (25 mg total) by mouth at bedtime.     senna-docusate 8.6-50 MG tablet  Commonly known as:  Senokot-S  Take 1 tablet by mouth at bedtime as needed for mild constipation.     spironolactone 25 MG tablet  Commonly known as:  ALDACTONE  Take 0.5 tablets (12.5 mg total) by mouth daily.     ticagrelor 90 MG Tabs tablet  Commonly known as:  BRILINTA  Take 1 tablet (90 mg total) by mouth 2 (two) times daily.        Relevant Imaging Results:  Relevant Lab Results:  Recent Labs    Additional Information    Settle, Juleen China, LCSW

## 2015-10-26 NOTE — Discharge Summary (Addendum)
Physician Discharge Summary  Justin Figueroa:[id number] DOB: [birth year]81 DOA: 10/25/2015  PCP: Kirstie Peri, MD  Admit date: 10/25/2015 Discharge date: 10/26/2015  Time spent: 35 minutes  Recommendations for Outpatient Follow-up:  1. Follow up with PCP next week. 2. Follow up with cardiology as soon as possible.    Discharge Diagnoses:  Principal Problem:   Chest pain Active Problems:   Ischemic cardiomyopathy   Hyperlipidemia   Seizure (HCC)   Dementia with behavioral disturbance   Neurosyphilis   Systolic congestive heart failure (HCC)   Discharge Condition:  Stable. Pain free.   Diet recommendation:  Heart heatlhy.   Filed Weights   10/25/15 2301  Weight: 60.873 kg (134 lb 3.2 oz)    History of present illness: Patient was admitted for chest pain and cardiac r/out by Dr Konrad Dolores on Oct 25, 2015.  As per his H and P:  " Chest pain. Acute onset area did substernal to right sided chest. Associated with diaphoresis, nausea, shortness of breath. Lasted approximately 3 hours initially and then resolved but continues to have waxing and waning nature with persistent ache. Additionally radiates occasionally to middle of back. Reports that this is similar to his previous chest pain when he was diagnosed with a heart attack. Improved after given 325 aspirin and nitroglycerin.  Of note patient with dementia and struggles to give history. Level V caveat applies.  Hospital Course: Patient had no further chest pain while in the hospital.  He was confused and was not able to give much meaningful history nor the nature of his chest pain.  His EKG was unremarkable and all his troponis were negative.  Review his prior record, he had a STEMI in August of this year, with troponin greater than 65 at that time.  He subsequently was admitted again for chest pain, and r/out in Oct 08, 2015.  It was felt that there was a musculoskeletal component to his chest pain as it was palpable.  In any event,  he was ruled out and will be discharged back to his SNF at this time.  I have not changed his medications, including his PCN IM injection for his neurosyphillis.  He will see his PCP next week, and follow up with his cardiologist as soon as possible.     Discharge Exam: Filed Vitals:   10/26/15 0931 10/26/15 1432  BP: 111/68 93/56  Pulse: 74 66  Temp:  97.8 F (36.6 C)  Resp:  20    Current Discharge Medication List    CONTINUE these medications which have NOT CHANGED   Details  aspirin EC 81 MG EC tablet Take 1 tablet (81 mg total) by mouth daily.    atorvastatin (LIPITOR) 80 MG tablet Take 80 mg by mouth daily.    calcium-vitamin D (OSCAL WITH D) 500-200 MG-UNIT tablet Take 1 tablet by mouth 2 (two) times daily.    carvedilol (COREG) 3.125 MG tablet Take 1 tablet (3.125 mg total) by mouth 2 (two) times daily with a meal. Qty: 60 tablet, Refills: 5    cholecalciferol (VITAMIN D) 1000 UNITS tablet Take 1,000 Units by mouth daily.    levETIRAcetam (KEPPRA) 500 MG tablet Take 1 tablet (500 mg total) by mouth 2 (two) times daily.    lisinopril (PRINIVIL,ZESTRIL) 2.5 MG tablet Take 1 tablet (2.5 mg total) by mouth every evening. Qty: 90 tablet, Refills: 0    Multiple Vitamins-Minerals (MULTIVITAMIN WITH MINERALS) tablet Take 1 tablet by mouth daily.    penicillin G potassium (  PFIZERPEN-G) [id number] UNITS injection Inject 4 Million Units into the muscle every 4 (four) hours. For prostitis until 10/28/15 started 10/13/15    QUEtiapine (SEROQUEL) 25 MG tablet Take 1 tablet (25 mg total) by mouth at bedtime.    spironolactone (ALDACTONE) 25 MG tablet Take 0.5 tablets (12.5 mg total) by mouth daily. Qty: 30 tablet, Refills: 5    ticagrelor (BRILINTA) 90 MG TABS tablet Take 1 tablet (90 mg total) by mouth 2 (two) times daily. Qty: 60 tablet, Refills: 10    acetaminophen (TYLENOL) 500 MG tablet Take 500 mg by mouth every 6 (six) hours as needed for mild pain.    ALPRAZolam (XANAX)  0.5 MG tablet Take 1 tablet (0.5 mg total) by mouth 3 (three) times daily as needed for anxiety. Qty: 20 tablet, Refills: 0    nitroGLYCERIN (NITROSTAT) 0.4 MG SL tablet Place 1 tablet (0.4 mg total) under the tongue every 5 (five) minutes x 3 doses as needed for chest pain. Qty: 25 tablet, Refills: 2    potassium chloride SA (K-DUR,KLOR-CON) 20 MEQ tablet Take 1 tablet (20 mEq total) by mouth daily. For 7 days. Started on 10-05-15    senna-docusate (SENOKOT-S) 8.6-50 MG tablet Take 1 tablet by mouth at bedtime as needed for mild constipation.      STOP taking these medications     Penicillin G Benzathine (BICILLIN L-A) 2400000 UNIT/4ML SUSP      penicillin G potassium 4 Million Units in dextrose 5 % 250 mL        No Known Allergies    The results of significant diagnostics from this hospitalization (including imaging, microbiology, ancillary and laboratory) are listed below for reference.    Significant Diagnostic Studies: Dg Chest 2 View  10/08/2015  CLINICAL DATA:  Two week history of cough EXAM: CHEST  2 VIEW COMPARISON:  October 03, 2015 FINDINGS: There is persistent airspace consolidation in the right lower lobe, slightly less compared to study from 5 days previously. There are persistent rather minimal pleural effusions bilaterally. There is atelectatic change in the left base which is stable. Heart is enlarged with pulmonary vascular within normal limits. There is a sizable paraesophageal type hernia. No adenopathy. Bones are somewhat osteoporotic. There is superior migration of both humeral heads consistent with chronic rotator cuff tears. IMPRESSION: Persistent right lower lobe airspace consolidation with pole minimally less opacity compared to 5 days previously. Atelectatic change left base. Minimal pleural effusions bilaterally. Stable cardiac prominence. Sizable paraesophageal hernia, stable. Evidence of chronic rotator cuff tears bilaterally. Electronically Signed   By:  Bretta Bang III M.D.   On: 10/08/2015 12:45   Ct Head Wo Contrast  10/25/2015  CLINICAL DATA:  Initial evaluation for acute facial droop. EXAM: CT HEAD WITHOUT CONTRAST TECHNIQUE: Contiguous axial images were obtained from the base of the skull through the vertex without intravenous contrast. COMPARISON:  Prior MRI from 10/09/2015. FINDINGS: Generalized cerebral atrophy with chronic microvascular ischemic disease present. Scattered vascular calcifications within the carotid siphons. Remote lacunar infarct within the left thalamus. No acute large vessel territory infarct. No intracranial hemorrhage. No mass lesion, midline shift, or mass effect. No hydrocephalus. No extra-axial fluid collection. Scalp soft tissues within normal limits. No acute abnormality about the orbits. Few scattered foci of soft tissue emphysema noted, like related to IV access. Paranasal sinuses are clear. Left mastoid effusion noted. Calvarium intact. IMPRESSION: 1. No acute intracranial process. 2. Age-related cerebral atrophy with chronic small vessel ischemic disease. 3. Left mastoid effusion. Electronically  Signed   By: Rise MuBenjamin  McClintock M.D.   On: 10/25/2015 21:08   Ct Head Wo Contrast  10/08/2015  CLINICAL DATA:  Acute encephalopathy, confusion EXAM: CT HEAD WITHOUT CONTRAST TECHNIQUE: Contiguous axial images were obtained from the base of the skull through the vertex without intravenous contrast. COMPARISON:  09/28/2015 FINDINGS: Moderate diffuse age-related atrophy. Mild chronic white matter disease. Tiny stable lacunar infarct left thalamus. No evidence of vascular territory infarct hemorrhage mass or extra-axial fluid. No hydrocephalus. Minimal age-related basal ganglia calcification bilaterally. Left mastoid air cells are nearly completely opacified. Partial opacification right mastoid air cells. IMPRESSION: No evidence of acute hemorrhage or infarct. New opacification left mastoid air cells, suggesting the presence  of fluid which can be seen with mastoiditis. Electronically Signed   By: Esperanza Heiraymond  Rubner M.D.   On: 10/08/2015 15:36   Mr Brain Wo Contrast  10/09/2015  CLINICAL DATA:  Altered mental status EXAM: MRI HEAD WITHOUT CONTRAST TECHNIQUE: Multiplanar, multiecho pulse sequences of the brain and surrounding structures were obtained without intravenous contrast. COMPARISON:  CT head 10/08/2015 FINDINGS: Moderate generalized atrophy. Ventricular enlargement consistent with atrophy. Pituitary not enlarged. Negative for acute infarct.  No significant chronic ischemia. Negative for hemorrhage or mass.  No shift of the midline structures Mastoid sinus effusion bilaterally. Paranasal sinuses clear. Normal orbit. IMPRESSION: Generalized atrophy.  Negative for acute infarct Bilateral mastoid sinus effusion. Electronically Signed   By: Marlan Palauharles  Clark M.D.   On: 10/09/2015 20:56   Dg Chest Portable 1 View  10/25/2015  CLINICAL DATA:  Chest pain and nausea. EXAM: PORTABLE CHEST 1 VIEW COMPARISON:  October 08, 2015 FINDINGS: Central catheter tip is in the superior vena cava. No pneumothorax. There is a sizable paraesophageal type hernia. There is left base atelectasis. There is patchy consolidation in the medial right base, less than on recent prior study. Lungs elsewhere clear. Heart is enlarged with pulmonary vascularity within normal limits. No adenopathy. There is degenerative change in both shoulders with superior migration of both humeral heads. IMPRESSION: Patchy consolidation medial right base, less than on recent prior study. Persistent atelectatic change left base. No new opacity appreciable. There is a paraesophageal type hernia. There is stable cardiomegaly. Chronic rotator cuff tears present bilaterally. Central catheter tip in superior vena cava. No pneumothorax. Electronically Signed   By: Bretta BangWilliam  Woodruff III M.D.   On: 10/25/2015 20:46   Dg Swallowing Func-speech Pathology  10/09/2015  m Objective Swallowing  Evaluation:   MBS Patient Details Name: Arleta CreekBobby G Valeriano MRN: [id number] Date of Birth: 06/28/1931 Today's Date: 10/09/2015 Time: SLP Start Time (ACUTE ONLY): 1028-SLP Stop Time (ACUTE ONLY): 1045 SLP Time Calculation (min) (ACUTE ONLY): 17 min Past Medical History: Past Medical History Diagnosis Date . CAD (coronary artery disease)    a. 07/2015 Ant STEMI/PCI: LM nl, LAD 70/100 (3.0x28 Promus DES), LCX 8639m, RCA 50p. EF 35%. . Ischemic cardiomyopathy    a. 07/2015 Echo: EF 25%, diff HK, mildly dil RV w/ mildly reduced fxn, mildly dil LA. Marland Kitchen. Hyperlipidemia  . Altered mental status  Past Surgical History: Past Surgical History Procedure Laterality Date . Cardiac catheterization N/A 07/19/2015   Procedure: Left Heart Cath and Coronary Angiography;  Surgeon: Tonny BollmanMichael Cooper, MD;  [facility];  Service: Cardiovascular;  Laterality: N/A; HPI: Other Pertinent Information: 81 year old male with a history of ischemic cardiopathy myopathy, coronary disease, recent community-acquired pneumonia prostatitis admitted with acute encephalopathy and chest pain from a nursing facility. Per MD note patient being admitted for  acute encephalopathy/delirium, suspected seizure activity. CXR persistent right lower lobe airspace consolidation with pole minimally less opacity compared to 5 days previously. CT no evidence of acute hemorrhage or infarct. BSE recommended MBS. No Data Recorded Assessment / Plan / Recommendation CHL IP CLINICAL IMPRESSIONS 10/09/2015 Therapy Diagnosis Moderate oral phase dysphagia;Mild pharyngeal phase dysphagia Clinical Impression Pt exhibited delayed oral manipulation and transit more so with regular texture and anterior spillage and anterior buccal retainment with thin barium. Mild senorimotor impairments led to delayed swallow to the pyriform sinuses and mild vallecular and pyriform residue (not sensed). Verbal and visual feedback for double swallow was effective in clearance. No penetration or  aspiration observed however mildly increased risk due to increased work of breathing during meals. Recommend Dys 2 texture and thin liquids, no straws, pills whole in applesauce and double swallow.     CHL IP TREATMENT RECOMMENDATION 10/09/2015 Treatment Recommendations Therapy as outlined in treatment plan below   CHL IP DIET RECOMMENDATION 10/09/2015 SLP Diet Recommendations Dysphagia 2 (Fine chop);Thin Liquid Administration via (None) Medication Administration Whole meds with puree Compensations Slow rate;Small sips/bites;Check for pocketing Postural Changes and/or Swallow Maneuvers (None)   CHL IP OTHER RECOMMENDATIONS 10/09/2015 Recommended Consults (None) Oral Care Recommendations Oral care QID Other Recommendations (None)   No flowsheet data found.  CHL IP FREQUENCY AND DURATION 10/09/2015 Speech Therapy Frequency (ACUTE ONLY) min 2x/week Treatment Duration 2 weeks   Pertinent Vitals/Pain  SLP Swallow Goals No flowsheet data found. No flowsheet data found.   CHL IP REASON FOR REFERRAL 10/09/2015 Reason for Referral Objectively evaluate swallowing function       No flowsheet data found.   [name] 10/09/2015, 11:14 AM [name] [name].Ed CCC-SLP Pager [phone]    Microbiology: No results found for this or any previous visit (from the past 240 hour(s)).   Labs: Basic Metabolic Panel:  Recent Labs Lab 10/25/15 2022  NA 142  K 3.9  CL 107  CO2 31  GLUCOSE 169*  BUN 12  CREATININE 0.73  CALCIUM 8.4*   Liver Function Tests:  Recent Labs Lab 10/25/15 2022  AST 93*  ALT 51  ALKPHOS 116  BILITOT 1.5*  PROT 6.4*  ALBUMIN 2.6*   CBC:  Recent Labs Lab 10/25/15 2022  WBC 10.4  NEUTROABS 8.5*  HGB 12.5*  HCT 39.4  MCV 91.2  PLT 240   Cardiac Enzymes:  Recent Labs Lab 10/25/15 2022 10/26/15 0137 10/26/15 0421  TROPONINI <0.03 0.03 0.03    Recent Labs  07/19/15 1630 07/20/15 0309  BNP 189.4* 682.1*   Signed:  Mickayla Trouten  [facility] 10/26/2015,  3:47 PM

## 2015-11-06 ENCOUNTER — Ambulatory Visit: Payer: Medicare Other | Admitting: Internal Medicine

## 2015-12-20 ENCOUNTER — Encounter: Payer: Medicare Other | Admitting: Cardiology

## 2015-12-20 ENCOUNTER — Encounter: Payer: Self-pay | Admitting: Cardiology

## 2015-12-20 NOTE — Progress Notes (Signed)
No show  This encounter was created in error - please disregard.

## 2016-01-08 ENCOUNTER — Encounter: Payer: Self-pay | Admitting: Neurology

## 2016-01-08 ENCOUNTER — Ambulatory Visit (INDEPENDENT_AMBULATORY_CARE_PROVIDER_SITE_OTHER): Payer: Medicare Other | Admitting: Neurology

## 2016-01-08 VITALS — BP 94/65 | HR 81 | Ht 73.0 in | Wt 138.5 lb

## 2016-01-08 DIAGNOSIS — R413 Other amnesia: Secondary | ICD-10-CM

## 2016-01-08 DIAGNOSIS — R269 Unspecified abnormalities of gait and mobility: Secondary | ICD-10-CM

## 2016-01-08 HISTORY — DX: Unspecified abnormalities of gait and mobility: R26.9

## 2016-01-08 HISTORY — DX: Other amnesia: R41.3

## 2016-01-08 MED ORDER — DONEPEZIL HCL 5 MG PO TABS: 5.0000 mg | ORAL_TABLET | Freq: Every day | ORAL | Status: DC

## 2016-01-08 NOTE — Progress Notes (Signed)
Reason for visit: Memory disturbance  Referring physician: Dr. Gevena Barre is a 82 y.o. male  History of present illness:  Justin Figueroa is an 82 year old right-handed white male with a history of a memory disturbance. The patient has been residing at the [facility] in [place], [place]. The patient has been in and out of this facility over the last couple years. The patient has a gait disturbance, he has fallen recently. He was in the hospital at [facility] with right facial droop in November 2016, CT and MRI brain evaluations at that time were unremarkable, the patient had been noted to have dementia at that point. The patient had a normal B12 level, and a RPR study was positive, Treponemal antibodies were positive, but the titer was low at 1:2. The patient was recently at [facility] on 12/28/2015. The patient had abdominal pain and nausea and vomiting. The patient was found to have some gallstones. The patient had been living at home off and on over the last couple years, but he requires assistance from a caseworker, he is able to get transportation and get help with his medications and appointments through his caseworker. The patient claims that he pays his own bills. He relies on Meals on Wheels for his food. Apparently his living situation is poor, he did not have proper heating and air-conditioning. The patient gets some assistance through his church. He denies any focal numbness or weakness of the face, arms, or legs. He denies headaches or vision changes, he denies problems controlling the bowels or the bladder.  Past Medical History  Diagnosis Date  . CAD (coronary artery disease)     a. 07/2015 Ant STEMI/PCI: LM nl, LAD 70/100 (3.0x28 Promus DES), LCX 30m, RCA 50p. EF 35%.  . Ischemic cardiomyopathy     a. 07/2015 Echo: EF 25%, diff HK, mildly dil RV w/ mildly reduced fxn, mildly dil LA.  Marland Kitchen Hyperlipidemia   . Altered mental status   . Neurosyphilis   .  Acute prostatitis   . Epilepsy (HCC)   . Osteoarthritis   . Encephalopathy   . Memory difficulties 01/08/2016  . Gait difficulty 01/08/2016  . HOH (hard of hearing)     Past Surgical History  Procedure Laterality Date  . Cardiac catheterization N/A 07/19/2015    Procedure: Left Heart Cath and Coronary Angiography;  Surgeon: Tonny Bollman, MD;  Location: [facility] INVASIVE CV LAB;  Service: Cardiovascular;  Laterality: N/A;    Family History  Problem Relation Age of Onset  . Heart disease Mother   . Peripheral vascular disease Father     Social history:  reports that he has never smoked. He has never used smokeless tobacco. He reports that he does not drink alcohol or use illicit drugs.  Medications:  Prior to Admission medications   Medication Sig Start Date End Date Taking? Authorizing Provider  aspirin EC 81 MG EC tablet Take 1 tablet (81 mg total) by mouth daily. 07/23/15  Yes Brittainy Sherlynn Carbon, PA-C  atorvastatin (LIPITOR) 80 MG tablet Take 80 mg by mouth daily.   Yes Historical Provider, MD  carvedilol (COREG) 3.125 MG tablet Take 1 tablet (3.125 mg total) by mouth 2 (two) times daily with a meal. 07/23/15  Yes Brittainy Sherlynn Carbon, PA-C  dextromethorphan-guaiFENesin (ROBITUSSIN-DM) 10-100 MG/5ML liquid Take 10 mLs by mouth every 6 (six) hours as needed for cough.   Yes Historical Provider, MD  finasteride (PROSCAR) 5 MG tablet Take 5 mg  by mouth daily.   Yes Historical Provider, MD  lisinopril (PRINIVIL,ZESTRIL) 2.5 MG tablet Take 1 tablet (2.5 mg total) by mouth every evening. 09/03/15  Yes Brittainy Sherlynn Carbon, PA-C  nitroGLYCERIN (NITROSTAT) 0.4 MG SL tablet Place 1 tablet (0.4 mg total) under the tongue every 5 (five) minutes x 3 doses as needed for chest pain. 07/23/15  Yes Brittainy Sherlynn Carbon, PA-C  sertraline (ZOLOFT) 25 MG tablet Take 25 mg by mouth daily.   Yes Historical Provider, MD  spironolactone (ALDACTONE) 25 MG tablet Take 0.5 tablets (12.5 mg total) by mouth daily. 07/23/15   Yes Brittainy Sherlynn Carbon, PA-C  tamsulosin (FLOMAX) 0.4 MG CAPS capsule Take 0.4 mg by mouth daily.   Yes Historical Provider, MD  ticagrelor (BRILINTA) 90 MG TABS tablet Take 1 tablet (90 mg total) by mouth 2 (two) times daily. 07/23/15  Yes Brittainy Sherlynn Carbon, PA-C     No Known Allergies  ROS:  Out of a complete 14 system review of symptoms, the patient complains only of the following symptoms, and all other reviewed systems are negative.  Memory disorder Gait disorder, falls  Blood pressure 94/65, pulse 81, height 6\' 1"  (1.854 m), weight 138 lb 8 oz (62.823 kg).  Physical Exam  General: The patient is alert and cooperative at the time of the examination.  Eyes: Pupils are equal, round, and reactive to light. Discs are flat bilaterally.  Neck: The neck is supple, no carotid bruits are noted.  Respiratory: The respiratory examination is clear.  Cardiovascular: The cardiovascular examination reveals a regular rate and rhythm, no obvious murmurs or rubs are noted.  Skin: Extremities are without significant edema.  Neurologic Exam  Mental status: The patient is alert and oriented x 2 at the time of the examination (not oriented to date). The Mini-Mental Status Examination done today shows a total score of 12/30. The patient is able to name 4 animals in 30 seconds..  Cranial nerves: Facial symmetry is not present. There is peripheral facial weakness on the right. There is good sensation of the face to pinprick and soft touch bilaterally. The strength of the facial muscles and the muscles to head turning and shoulder shrug are normal bilaterally. Speech is well enunciated, no aphasia or dysarthria is noted. Extraocular movements are full. Visual fields are full. The tongue is midline, and the patient has symmetric elevation of the soft palate. No obvious hearing deficits are noted.  Motor: The motor testing reveals 5 over 5 strength of all 4 extremities. Good symmetric motor tone is  noted throughout.  Sensory: Sensory testing is intact to pinprick, soft touch, vibration sensation, and position sense on all 4 extremities, with exception some decrease in position sense in the feet. No evidence of extinction is noted.  Coordination: Cerebellar testing reveals good finger-nose-finger bilaterally. The patient has severe apraxia with the use of the lower extremities, unable to perform heel-to-shin.  Gait and station: The patient is able to ambulate a few steps with assistance, slightly wide-based unsteady gait. Can gait was not attempted. Romberg is negative. No drift is seen.  Reflexes: Deep tendon reflexes are symmetric, but are depressed bilaterally. Toes are downgoing bilaterally.    CT head 10/25/15:  IMPRESSION: 1. No acute intracranial process. 2. Age-related cerebral atrophy with chronic small vessel ischemic disease. 3. Left mastoid effusion.  * CT scan images were reviewed online. I agree with the written report.   Assessment/Plan:  1. Dementia, moderate range  2. Gait disorder  3. Positive  RPR, low titer  The patient appears to have a significant underlying dementia. He will not be able to live independently, he requires supervision and assistance with all activities of daily living. The pursuit of guardianship is reasonable at this time. The patient is to be placed on Aricept 5 mg at night for a month, then go to 10 mg at night if he is tolerating the medication. He will follow-up in 6 months for an evaluation. He has already had a recent CT and MRI of the brain, and recent blood work.   Marlan Palau MD 01/08/2016 8:54 PM  [facility] [address] [place], [place] [address]  Phone [phone] Fax [phone]

## 2016-02-11 ENCOUNTER — Encounter: Payer: Self-pay | Admitting: *Deleted

## 2016-02-15 ENCOUNTER — Ambulatory Visit (INDEPENDENT_AMBULATORY_CARE_PROVIDER_SITE_OTHER): Payer: Self-pay | Admitting: Cardiology

## 2016-02-15 ENCOUNTER — Encounter: Payer: Self-pay | Admitting: Cardiology

## 2016-02-15 VITALS — BP 100/60 | HR 86 | Ht 66.0 in | Wt 142.0 lb

## 2016-02-15 DIAGNOSIS — I255 Ischemic cardiomyopathy: Secondary | ICD-10-CM

## 2016-02-15 DIAGNOSIS — I2510 Atherosclerotic heart disease of native coronary artery without angina pectoris: Secondary | ICD-10-CM

## 2016-02-15 DIAGNOSIS — F0390 Unspecified dementia without behavioral disturbance: Secondary | ICD-10-CM

## 2016-02-15 DIAGNOSIS — E785 Hyperlipidemia, unspecified: Secondary | ICD-10-CM

## 2016-02-15 NOTE — Patient Instructions (Signed)
Your physician recommends that you continue on your current medications as directed. Please refer to the Current Medication list given to you today. Your physician recommends that you schedule a follow-up appointment in: 6 months. You will receive a reminder letter in the mail in about 4 months reminding you to call and schedule your appointment. If you don't receive this letter, please contact our office. 

## 2016-02-15 NOTE — Progress Notes (Signed)
Cardiology Office Note  Date: 02/15/2016   ID: Justin CreekBobby G Remo, DOB Feb 24, 1931, MRN [id number]  PCP: [name],ASHISH, MD  Primary Cardiologist: Nona DellSamuel McDowell, MD   Chief Complaint  Patient presents with  . Coronary Artery Disease  . Cardiomyopathy    History of Present Illness: Justin Figueroa is an 82 y.o. male seen by me for the first time in October 2016. He presents for a routine follow-up visit. Record review [facility] with acute pancreatitis. Abdominal and pelvic CT from 02/06/2016 reported mild peripancreatic edema consistent with pancreatitis. No pseudocyst was noted. Also present was cholelithiasis without obvious gallbladder wall thickening. I reviewed his recent lab work and ECG as noted below. He was managed by Dr. Sherryll BurgerShah. [facility].  He presents today with an Geophysicist/field seism[facility]ass[facility]. He indicates that he feels much better, is able to eat, not having abdominal pain or emesis. No chest pain. I reviewed his medications which are outlined below. Cardiac regimen includes aspirin, Brilinta, Coreg, Lipitor, lisinopril, Aldactone, and as needed nitroglycerin.  He is a very tangential historian, appears to be pleasantly demented based on discussion today.  Past Medical History  Diagnosis Date  . CAD (coronary artery disease)     a. 07/2015 Ant STEMI/PCI: LM nl, LAD 70/100 (3.0x28 Promus DES), LCX 5333m, RCA 50p. EF 35%.  . Ischemic cardiomyopathy     a. 07/2015 Echo: EF 25%, diff HK, mildly dil RV w/ mildly reduced fxn, mildly dil LA.  Marland Kitchen. Hyperlipidemia   . Altered mental status   . Neurosyphilis   . Acute prostatitis   . Epilepsy (HCC)   . Osteoarthritis   . Encephalopathy   . Memory difficulties 01/08/2016  . Gait difficulty 01/08/2016  . HOH (hard of hearing)     Past Surgical History  Procedure Laterality Date  . Cardiac catheterization N/A 07/19/2015    Procedure: Left Heart Cath and Coronary  Angiography;  Surgeon: Tonny BollmanMichael Cooper, MD;  [facility];  Service: Cardiovascular;  Laterality: N/A;    Current Outpatient Prescriptions  Medication Sig Dispense Refill  . aspirin EC 81 MG EC tablet Take 1 tablet (81 mg total) by mouth daily.    Marland Kitchen. atorvastatin (LIPITOR) 80 MG tablet Take 80 mg by mouth daily.    . carvedilol (COREG) 3.125 MG tablet Take 1 tablet (3.125 mg total) by mouth 2 (two) times daily with a meal. 60 tablet 5  . dextromethorphan-guaiFENesin (ROBITUSSIN-DM) 10-100 MG/5ML liquid Take 10 mLs by mouth every 6 (six) hours as needed for cough.    . donepezil (ARICEPT) 5 MG tablet Take 1 tablet (5 mg total) by mouth at bedtime. 30 tablet 1  . finasteride (PROSCAR) 5 MG tablet Take 5 mg by mouth daily.    Marland Kitchen. lisinopril (PRINIVIL,ZESTRIL) 2.5 MG tablet Take 1 tablet (2.5 mg total) by mouth every evening. 90 tablet 0  . nitroGLYCERIN (NITROSTAT) 0.4 MG SL tablet Place 1 tablet (0.4 mg total) under the tongue every 5 (five) minutes x 3 doses as needed for chest pain. 25 tablet 2  . sertraline (ZOLOFT) 25 MG tablet Take 25 mg by mouth daily.    Marland Kitchen. spironolactone (ALDACTONE) 25 MG tablet Take 25 mg by mouth daily.    . tamsulosin (FLOMAX) 0.4 MG CAPS capsule Take 0.4 mg by mouth daily.    . ticagrelor (BRILINTA) 90 MG TABS tablet Take 1 tablet (90 mg total) by mouth 2 (two)  times daily. 60 tablet 10   No current facility-administered medications for this visit.   Allergies:  Review of patient's allergies indicates no known allergies.   Social History: The patient  reports that he has never smoked. He has never used smokeless tobacco. He reports that he does not drink alcohol or use illicit drugs.   ROS:  Please see the history of present illness. Otherwise, complete review of systems is positive for decreased hearing.  All other systems are reviewed and negative.   Physical Exam: VS:  BP 100/60 mmHg  Pulse 86  Ht  (1.676 m)  Wt 142 lb (64.411 kg)  BMI  22.93 kg/m2  SpO2 98%, BMI Body mass index is 22.93 kg/(m^2).  Wt Readings from Last 3 Encounters:  02/15/16 142 lb (64.411 kg)  01/08/16 138 lb 8 oz (62.823 kg)  10/25/15 134 lb 3.2 oz (60.873 kg)    General: Elderly male seated in wheelchair, appears comfortable at rest. HEENT: Conjunctiva and lids normal, oropharynx clear with poor dentition. Neck: Supple, no elevated JVP or carotid bruits, no thyromegaly. Lungs: Clear to auscultation, nonlabored breathing at rest. Cardiac: Regular rate and rhythm, no S3 or significant systolic murmur, no pericardial rub. Abdomen: Soft, nontender, bowel sounds present. Extremities: No pitting edema, distal pulses 2+. Skin: Warm and dry. Musculoskeletal: Mild kyphosis. Neuropsychiatric: Alert and oriented x1, affect grossly appropriate. Decreased hearing.  ECG: I personally reviewed the prior tracing from 02/06/2016 which showed sinus rhythm with PVCs, old anterior infarct pattern, R' in lead V1 and V2, rule out old inferior infarct pattern versus left anterior fascicular block.  Recent Labwork: 07/20/2015: B Natriuretic Peptide 682.1* 10/09/2015: TSH 2.995 10/25/2015: ALT 51; AST 93*; BUN 12; Creatinine, Ser 0.73; Hemoglobin 12.5*; Platelets 240; Potassium 3.9; Sodium 142     Component Value Date/Time   CHOL 153 07/20/2015 0309   TRIG 118 07/20/2015 0309   HDL 25* 07/20/2015 0309   CHOLHDL 6.1 07/20/2015 0309   VLDL 24 07/20/2015 0309   LDLCALC 104* 07/20/2015 0309  March 2017: BUN 17, creatinine 0.8, potassium 4.0, troponin T less than 0.01, hemoglobin 14.8, platelets 334, AST 203, ALT 127, lipase 1623, amylase 387, blood cultures negative  Other Studies Reviewed Today:  Echocardiogram 07/20/2015: Study Conclusions  - Left ventricle: The cavity size was normal. Wall thickness was  normal. The estimated ejection fraction was 25%. Diffuse  hypokinesis. - Right ventricle: The cavity size was mildly dilated. Systolic  function was mildly  reduced. - Right atrium: The atrium was mildly dilated.  Assessment and Plan:  1. CAD status post anterior STEMI in August 2016 status post DES to the LAD. He is symptomatically stable. No ACS with recent hospitalization with pancreat[facility]. Cardiac regimen is in good order, no changes made today.  2. Hyperlipidemia, he continues on Lipitor. Recommend that he keep follow with Dr. Sherryll Burger for review of LFTs to make sure that these have trended down after his bout of pancreatitis.  3. Cardiomyopathy with LVEF 25% as of August 2016. Possibly mixed picture with diffuse hypokinesis, although ischemic cardiomyopathy is certainly likely. Plan is to continue conservative management, he is not a good candidate for defibrillator.  4. Dementia, evaluated by Dr. Anne Hahn in February. He is on Aricept.  Current medicines were reviewed with the patient today.  Disposition: FU with me in 6 months.   Signed, Jonelle Sidle, MD, [facility] 02/15/2016 8:44 AM    [facility] at [facility] [address] [place],  [place], [place] [address] Phone: [phone]; Fax: [phone]

## 2016-07-09 ENCOUNTER — Ambulatory Visit (INDEPENDENT_AMBULATORY_CARE_PROVIDER_SITE_OTHER): Payer: Medicare Other | Admitting: Adult Health

## 2016-07-09 ENCOUNTER — Encounter: Payer: Self-pay | Admitting: Adult Health

## 2016-07-09 VITALS — BP 107/65 | HR 77 | Ht 66.0 in | Wt 154.2 lb

## 2016-07-09 DIAGNOSIS — F0390 Unspecified dementia without behavioral disturbance: Secondary | ICD-10-CM | POA: Diagnosis not present

## 2016-07-09 MED ORDER — DONEPEZIL HCL 5 MG PO TABS: 5.0000 mg | ORAL_TABLET | Freq: Every day | ORAL | 5 refills | Status: DC

## 2016-07-09 NOTE — Progress Notes (Signed)
PATIENT: Justin Figueroa DOB: [birth year]  REASON FOR VISIT: follow up- memory HISTORY FROM: patient  HISTORY OF PRESENT ILLNESS: Justin Figueroa is an 82 year old male with a history of memory disturbance. He returns today for follow-up. He has a driver from [facility] with him today. He is not taking Aricept. Apparently this was stopped while in the hospital and in transition back to [facility] this was never restarted. The patient does require assistance with ADLs. He states that he sleeping well at night. Good appetite. Denies any hallucinations or delusions. No change in behavior. The patient states several times when he "goes back home." However he will permanently be residing at [facility]. Apparently he has no family. He denies any new neurological symptoms. He returns today for an evaluation . HISTORY 01/08/16 (WILLIS):Justin Figueroa is an 82 year old right-handed white male with a history of a memory disturbance. The patient has been residing at the [facility] in [place], [place]. The patient has been in and out of this facility over the last couple years. The patient has a gait disturbance, he has fallen recently. He was in the hospital at [facility] with right facial droop in November 2016, CT and MRI brain evaluations at that time were unremarkable, the patient had been noted to have dementia at that point. The patient had a normal B12 level, and a RPR study was positive, Treponemal antibodies were positive, but the titer was low at 1:2. The patient was recently at [facility] on 12/28/2015. The patient had abdominal pain and nausea and vomiting. The patient was found to have some gallstones. The patient had been living at home off and on over the last couple years, but he requires assistance from a caseworker, he is able to get transportation and get help with his medications and appointments through his caseworker. The patient claims that he pays his own bills. He relies  on Meals on Wheels for his food. Apparently his living situation is poor, he did not have proper heating and air-conditioning. The patient gets some assistance through his church. He denies any focal numbness or weakness of the face, arms, or legs. He denies headaches or vision changes, he denies problems controlling the bowels or the bladder.   REVIEW OF SYSTEMS: Out of a complete 14 system review of symptoms, the patient complains only of the following symptoms, and all other reviewed systems are negative.  History of present illness  ALLERGIES: No Known Allergies  HOME MEDICATIONS: Outpatient Medications Prior to Visit  Medication Sig Dispense Refill  . aspirin EC 81 MG EC tablet Take 1 tablet (81 mg total) by mouth daily.    Marland Kitchen atorvastatin (LIPITOR) 80 MG tablet Take 80 mg by mouth daily.    . carvedilol (COREG) 3.125 MG tablet Take 1 tablet (3.125 mg total) by mouth 2 (two) times daily with a meal. 60 tablet 5  . dextromethorphan-guaiFENesin (ROBITUSSIN-DM) 10-100 MG/5ML liquid Take 10 mLs by mouth every 6 (six) hours as needed for cough.    . donepezil (ARICEPT) 5 MG tablet Take 1 tablet (5 mg total) by mouth at bedtime. 30 tablet 1  . finasteride (PROSCAR) 5 MG tablet Take 5 mg by mouth daily.    Marland Kitchen lisinopril (PRINIVIL,ZESTRIL) 2.5 MG tablet Take 1 tablet (2.5 mg total) by mouth every evening. 90 tablet 0  . nitroGLYCERIN (NITROSTAT) 0.4 MG SL tablet Place 1 tablet (0.4 mg total) under the tongue every 5 (five) minutes x 3 doses as needed  for chest pain. 25 tablet 2  . sertraline (ZOLOFT) 25 MG tablet Take 25 mg by mouth daily.    Marland Kitchen spironolactone (ALDACTONE) 25 MG tablet Take 25 mg by mouth daily.    . tamsulosin (FLOMAX) 0.4 MG CAPS capsule Take 0.4 mg by mouth daily.    . ticagrelor (BRILINTA) 90 MG TABS tablet Take 1 tablet (90 mg total) by mouth 2 (two) times daily. 60 tablet 10   No facility-administered medications prior to visit.     PAST MEDICAL HISTORY: Past Medical  History:  Diagnosis Date  . Acute prostatitis   . Altered mental status   . CAD (coronary artery disease)    a. 07/2015 Ant STEMI/PCI: LM nl, LAD 70/100 (3.0x28 Promus DES), LCX 18m, RCA 50p. EF 35%.  . Encephalopathy   . Epilepsy (HCC)   . Gait difficulty 01/08/2016  . HOH (hard of hearing)   . Hyperlipidemia   . Ischemic cardiomyopathy    a. 07/2015 Echo: EF 25%, diff HK, mildly dil RV w/ mildly reduced fxn, mildly dil LA.  . Memory difficulties 01/08/2016  . Neurosyphilis   . Osteoarthritis     PAST SURGICAL HISTORY: Past Surgical History:  Procedure Laterality Date  . CARDIAC CATHETERIZATION N/A 07/19/2015   Procedure: Left Heart Cath and Coronary Angiography;  Surgeon: Tonny Bollman, MD;  Location: [facility] INVASIVE CV LAB;  Service: Cardiovascular;  Laterality: N/A;    FAMILY HISTORY: Family History  Problem Relation Age of Onset  . Heart disease Mother   . Peripheral vascular disease Father     SOCIAL HISTORY: Social History   Social History  . Marital status: Married    Spouse name: N/A  . Number of children: N/A  . Years of education: N/A   Occupational History  . Not on file.   Social History Main Topics  . Smoking status: Never Smoker  . Smokeless tobacco: Never Used  . Alcohol use No  . Drug use: No  . Sexual activity: No   Other Topics Concern  . Not on file   Social History Narrative  . No narrative on file      PHYSICAL EXAM  Vitals:   07/09/16 1438  BP: 107/65  Pulse: 77  Weight: 154 lb 3.2 oz (69.9 kg)  Height:  (1.676 m)   Body mass index is 24.89 kg/m.   MMSE - Mini Mental State Exam 07/09/2016 01/08/2016  Orientation to time 1 2  Orientation to Place 3 2  Registration 3 3  Attention/ Calculation 0 0  Recall 0 0  Language- name 2 objects 2 2  Language- repeat 0 1  Language- follow 3 step command 3 2  Language- read & follow direction 0 0  Write a sentence 0 0  Copy design 1 0  Total score 13 12     Generalized: Well  developed, in no acute distress   Neurological examination  Mentation: Alert. Follows all commands speech and language fluent Cranial nerve II-XII: Pupils were equal round reactive to light. Extraocular movements were full, visual field were full on confrontational test. Facial sensation and strength were normal. Uvula tongue midline. Head turning and shoulder shrug  were normal and symmetric. Motor: The motor testing reveals 5 over 5 strength of all 4 extremities. Good symmetric motor tone is noted throughout.  Sensory: Sensory testing is intact to soft touch on all 4 extremities. No evidence of extinction is noted.  Coordination: Cerebellar testing reveals good finger-nose-finger and heel-to-shin bilaterally.  Gait and station: Patient is in a wheelchair  Reflexes: Deep tendon reflexes are symmetric and normal bilaterally.   DIAGNOSTIC DATA (LABS, IMAGING, TESTING) - I reviewed patient records, labs, notes, testing and imaging myself where available.  Lab Results  Component Value Date   WBC 10.4 10/25/2015   HGB 12.5 (L) 10/25/2015   HCT 39.4 10/25/2015   MCV 91.2 10/25/2015   PLT 240 10/25/2015      Component Value Date/Time   NA 142 10/25/2015 2022   K 3.9 10/25/2015 2022   CL 107 10/25/2015 2022   CO2 31 10/25/2015 2022   GLUCOSE 169 (H) 10/25/2015 2022   BUN 12 10/25/2015 2022   CREATININE 0.73 10/25/2015 2022   CALCIUM 8.4 (L) 10/25/2015 2022   PROT 6.4 (L) 10/25/2015 2022   ALBUMIN 2.6 (L) 10/25/2015 2022   AST 93 (H) 10/25/2015 2022   ALT 51 10/25/2015 2022   ALKPHOS 116 10/25/2015 2022   BILITOT 1.5 (H) 10/25/2015 2022   GFRNONAA >60 10/25/2015 2022   GFRAA >60 10/25/2015 2022   Lab Results  Component Value Date   CHOL 153 07/20/2015   HDL 25 (L) 07/20/2015   LDLCALC 104 (H) 07/20/2015   TRIG 118 07/20/2015   CHOLHDL 6.1 07/20/2015   Lab Results  Component Value Date   HGBA1C 6.2 (H) 07/19/2015   Lab Results  Component Value Date   VITAMINB12 736  10/08/2015   Lab Results  Component Value Date   TSH 2.995 10/09/2015      ASSESSMENT AND PLAN 82 y.o. year old male  has a past medical history of Acute prostatitis; Altered mental status; CAD (coronary artery disease); Encephalopathy; Epilepsy (HCC); Gait difficulty (01/08/2016); HOH (hard of hearing); Hyperlipidemia; Ischemic cardiomyopathy; Memory difficulties (01/08/2016); Neurosyphilis; and Osteoarthritis. here with:  1. Dementia  Patient's memory score today is MMSE 13/30. We will restart the patient on Aricept. He will begin with 5 mg at bedtime. I have provided the transporterwith a prescription to [facility] to [facility]. Advised that if the patient's symptoms worsen or he develops any new symptoms that they should let us know. He will follow-up in 6 months or sooner if needed.    Butch PennyMegan Dorenda Pfannenstiel, MSN, NP-C 07/09/2016, 2:51 PM [facility] [address], [address] [place], [place] [address] [phone]

## 2016-07-09 NOTE — Patient Instructions (Signed)
Restart Aricept 5 mg at bedtime If your symptoms worsen or you develop new symptoms please let us know.

## 2016-07-09 NOTE — Progress Notes (Signed)
I have read the note, and I agree with the clinical assessment and plan.  Jimeka Balan KEITH   

## 2016-08-18 NOTE — Progress Notes (Signed)
Cardiology Office Note  Date: 08/19/2016   ID: Justin CreekBobby G Clover, DOB Oct 15, 1931, MRN [id number]  PCP: [name],ASHISH, MD  Primary Cardiologist: Nona DellSamuel Almarie Kurdziel, MD   Chief Complaint  Patient presents with  . Coronary Artery Disease    History of Present Illness: Justin Figueroa is an 82 y.o. male Iast seen in March. He presents with an Geophysicist/field seismologistassistant [facility] the [facility] for a follow-up visit. History is limited by dementia. It sounds as if he had a recent episode of reflux treated with antacids, had eaten dinner very close to bedtime. Otherwise, no obvious angina symptoms or nitroglycerin use.  I reviewed his medications and will plan to stop Brilinta at this point since he is one year out from DES to the LAD.  He had recent lab work which is outlined below.  Past Medical History:  Diagnosis Date  . Acute prostatitis   . Altered mental status   . CAD (coronary artery disease)    a. 07/2015 Ant STEMI/PCI: LM nl, LAD 70/100 (3.0x28 Promus DES), LCX 5945m, RCA 50p. EF 35%.  . Encephalopathy   . Epilepsy (HCC)   . Gait difficulty 01/08/2016  . HOH (hard of hearing)   . Hyperlipidemia   . Ischemic cardiomyopathy    a. 07/2015 Echo: EF 25%, diff HK, mildly dil RV w/ mildly reduced fxn, mildly dil LA.  . Memory difficulties 01/08/2016  . Neurosyphilis   . Osteoarthritis     Past Surgical History:  Procedure Laterality Date  . CARDIAC CATHETERIZATION N/A 07/19/2015   Procedure: Left Heart Cath and Coronary Angiography;  Surgeon: Tonny BollmanMichael Cooper, MD;  [facility];  Service: Cardiovascular;  Laterality: N/A;    Current Outpatient Prescriptions  Medication Sig Dispense Refill  . aspirin EC 81 MG EC tablet Take 1 tablet (81 mg total) by mouth daily.    Marland Kitchen. atorvastatin (LIPITOR) 80 MG tablet Take 80 mg by mouth daily.    . Calcium & Magnesium Carbonates (MYLANTA PO) Take 20 mg by mouth daily as needed.    . carvedilol (COREG) 3.125 MG tablet Take 1 tablet (3.125 mg total)  by mouth 2 (two) times daily with a meal. 60 tablet 5  . dextromethorphan-guaiFENesin (ROBITUSSIN-DM) 10-100 MG/5ML liquid Take 10 mLs by mouth every 6 (six) hours as needed for cough.    . divalproex (DEPAKOTE) 125 MG DR tablet Take 125 mg by mouth 2 (two) times daily.    Marland Kitchen. donepezil (ARICEPT) 5 MG tablet Take 1 tablet (5 mg total) by mouth at bedtime. 30 tablet 5  . finasteride (PROSCAR) 5 MG tablet Take 5 mg by mouth daily.    Marland Kitchen. lisinopril (PRINIVIL,ZESTRIL) 2.5 MG tablet Take 1 tablet (2.5 mg total) by mouth every evening. 90 tablet 0  . loperamide (IMODIUM) 2 MG capsule Take by mouth as needed for diarrhea or loose stools.    . nitroGLYCERIN (NITROSTAT) 0.4 MG SL tablet Place 1 tablet (0.4 mg total) under the tongue every 5 (five) minutes x 3 doses as needed for chest pain. 25 tablet 2  . sertraline (ZOLOFT) 25 MG tablet Take 25 mg by mouth daily.    Marland Kitchen. spironolactone (ALDACTONE) 25 MG tablet Take 25 mg by mouth daily.    . tamsulosin (FLOMAX) 0.4 MG CAPS capsule Take 0.4 mg by mouth daily.     No current facility-administered medications for this visit.    Allergies:  Review of patient's allergies indicates no known allergies.   Social History: The patient  reports that he has never smoked. He has never used smokeless tobacco. He reports that he does not drink alcohol or use drugs.   ROS:  Please see the history of present illness. Otherwise, complete review of systems is positive for decreased hearing.  All other systems are reviewed and negative.   Physical Exam: VS:  BP 96/60   Pulse (!) 58   Ht 5\' 6"  (1.676 m)   Wt 162 lb (73.5 kg)   SpO2 91%   BMI 26.15 kg/m , BMI Body mass index is 26.15 kg/m.  Wt Readings from Last 3 Encounters:  08/19/16 162 lb (73.5 kg)  07/09/16 154 lb 3.2 oz (69.9 kg)  02/15/16 142 lb (64.4 kg)    General: Elderly male seated in wheelchair, appears comfortable at rest. HEENT: Conjunctiva and lids normal, oropharynx clear with poor dentition. Neck:  Supple, no elevated JVP or carotid bruits, no thyromegaly. Lungs: Clear to auscultation, nonlabored breathing at rest. Cardiac: Regular rate and rhythm, no S3 or significant systolic murmur, no pericardial rub. Abdomen: Soft, nontender, bowel sounds present. Extremities: No pitting edema, distal pulses 2+. Skin: Warm and dry. Musculoskeletal: Mild kyphosis. Neuropsychiatric: Alert and oriented x1, affect grossly appropriate. Decreased hearing.  ECG: I personally reviewed the tracing from 02/06/2016 which showed sinus rhythm with PVCs, old anterior infarct pattern, R' in lead V1 and V2, rule out old inferior infarct pattern versus left anterior fascicular block.  Recent Labwork:  September 2017: Hemoglobin 13.5, platelets 207, potassium 4.3, BUN 17, creatinine 0.8, AST 14, ALT 11, TSH 3.2, hemoglobin A1c 6.0  Other Studies Reviewed Today:  Echocardiogram 07/20/2015: Study Conclusions  - Left ventricle: The cavity size was normal. Wall thickness was  normal. The estimated ejection fraction was 25%. Diffuse  hypokinesis. - Right ventricle: The cavity size was mildly dilated. Systolic  function was mildly reduced. - Right atrium: The atrium was mildly dilated.  Assessment and Plan:  1. CAD status post anterior STEMI in August 2016 treated with DES to the LAD. He is symptomatically stable at this point we will stop Brilinta, otherwise continue his baseline regimen.  2. Mixed cardiomyopathy with LVEF approximately 25%, managed conservatively at this time on medical therapy. Not a good candidate for ICD.  3. Hyperlipidemia, continues on Lipitor at this point. Keep follow-up with PCP.  Current medicines were reviewed with the patient today.  Disposition: Follow-up with me in 6 months.  Signed, Jonelle Sidle, MD, [facility] 08/19/2016 11:55 AM    [facility] at [facility] [address] [place], [place], [place] [address] Phone: [phone]; Fax: [phone]

## 2016-08-19 ENCOUNTER — Encounter: Payer: Self-pay | Admitting: Cardiology

## 2016-08-19 ENCOUNTER — Ambulatory Visit (INDEPENDENT_AMBULATORY_CARE_PROVIDER_SITE_OTHER): Payer: Medicare Other | Admitting: Cardiology

## 2016-08-19 VITALS — BP 96/60 | HR 58 | Ht 66.0 in | Wt 162.0 lb

## 2016-08-19 DIAGNOSIS — E785 Hyperlipidemia, unspecified: Secondary | ICD-10-CM | POA: Diagnosis not present

## 2016-08-19 DIAGNOSIS — I2510 Atherosclerotic heart disease of native coronary artery without angina pectoris: Secondary | ICD-10-CM | POA: Diagnosis not present

## 2016-08-19 DIAGNOSIS — I255 Ischemic cardiomyopathy: Secondary | ICD-10-CM | POA: Diagnosis not present

## 2016-08-19 NOTE — Patient Instructions (Addendum)
Medication Instructions:   Stop Brilinta.  Continue Aspirin 81mg  daily.  Continue all other medications.    Labwork: none  Testing/Procedures: none  Follow-Up: Your physician wants you to follow up in: 6 months.  You will receive a reminder letter in the mail one-two months in advance.  If you don't receive a letter, please call our office to schedule the follow up appointment   Any Other Special Instructions Will Be Listed Below (If Applicable).  If you need a refill on your cardiac medications before your next appointment, please call your pharmacy.

## 2017-01-22 ENCOUNTER — Ambulatory Visit: Payer: Medicare Other | Admitting: Adult Health

## 2017-02-04 ENCOUNTER — Ambulatory Visit (INDEPENDENT_AMBULATORY_CARE_PROVIDER_SITE_OTHER): Payer: Medicare Other | Admitting: Adult Health

## 2017-02-04 ENCOUNTER — Encounter: Payer: Self-pay | Admitting: Adult Health

## 2017-02-04 VITALS — BP 106/71 | HR 50 | Wt 155.0 lb

## 2017-02-04 DIAGNOSIS — F0390 Unspecified dementia without behavioral disturbance: Secondary | ICD-10-CM | POA: Diagnosis not present

## 2017-02-04 NOTE — Patient Instructions (Addendum)
Unable to complete memory test due to eyesight Continue Aricept 10 mg at bedtime  Ok for the patient to follow up as needed with our office as long as PCP is agreeable to manage memory and aricept.  If your symptoms worsen or you develop new symptoms please let us know.

## 2017-02-04 NOTE — Progress Notes (Signed)
I have read the note, and I agree with the clinical assessment and plan.  Justin Figueroa   

## 2017-02-04 NOTE — Progress Notes (Signed)
PATIENT: Justin Figueroa DOB: Nov 17, 1931  REASON FOR VISIT: follow up- memory disturbance HISTORY FROM: patient  HISTORY OF PRESENT ILLNESS: Today 02/04/17 Justin Figueroa is an 83 year old male with a history of memory disturbance. He returns today for follow-up. The patient is a resident at the [facility]. He requires assistance with most ADLs. Denies hallucinations or delusions. No change in behavior. Denies any trouble sleeping. He does require assistance with most ADLs. At the last visit Justin was started for the patient. According to his medication list he is now on Justin Figueroa. He reports that he is tolerating it well. Reports good appetite. He is hard of hearing. He is accompanied today by a employee of [facility].   HISTORY 07/09/16: Justin Figueroa is an 83 year old male with a history of memory disturbance. He returns today for follow-up. He has a driver from [facility] with him today. He is not taking Justin. Apparently this was stopped while in the hospital and in transition back to [facility] this was never restarted. The patient does require assistance with ADLs. He states that he sleeping well at night. Good appetite. Denies any hallucinations or delusions. No change in behavior. The patient states several times when he "goes back home." However he will permanently be residing [facility]. Apparently he has no family. He denies any new neurological symptoms. He returns today for an evaluation . HISTORY 01/08/16 (WILLIS):Justin Figueroa is an 83 year old right-handed white male with a history of a memory disturbance. The patient has been residing at the [facility] in [place], [place]. The patient has been in and out of this facility over the last couple years. The patient has a gait disturbance, he has fallen recently. He was in the hospital at [facility] with right facial droop in November 2016, CT and MRI brain evaluations at that time were unremarkable, the patient  had been noted to have dementia at that point. The patient had a normal B12 level, and a RPR study was positive, Treponemal antibodies were positive, but the titer was low at 1:2. The patient was recently at [facility] on 12/28/2015. The patient had abdominal pain and nausea and vomiting. The patient was found to have some gallstones. The patient had been living at home off and on over the last couple years, but he requires assistance from a caseworker, he is able to get transportation and get help with his medications and appointments through his caseworker. The patient claims that he pays his own bills. He relies on Meals on Wheels for his food. Apparently his living situation is poor, he did not have proper heating and air-conditioning. The patient gets some assistance through his church. He denies any focal numbness or weakness of the face, arms, or legs. He denies headaches or vision   REVIEW OF SYSTEMS: Out of a complete 14 system review of symptoms, the patient complains only of the following symptoms, and all other reviewed systems are negative.  See history of present illness  ALLERGIES: No Known Allergies  HOME MEDICATIONS: Outpatient Medications Prior to Visit  Medication Sig Dispense Refill  . aspirin EC 81 Figueroa EC tablet Take 1 tablet (81 Figueroa total) by mouth daily.    Marland Kitchen. atorvastatin (LIPITOR) 80 Figueroa tablet Take 80 Figueroa by mouth daily.    . carvedilol (COREG) 3.125 Figueroa tablet Take 1 tablet (3.125 Figueroa total) by mouth 2 (two) times daily with a meal. 60 tablet 5  . dextromethorphan-guaiFENesin (ROBITUSSIN-DM) 10-100 Figueroa/5ML  liquid Take 10 mLs by mouth every 6 (six) hours as needed for cough.    . divalproex (DEPAKOTE) 125 Figueroa DR tablet Take 125 Figueroa by mouth 2 (two) times daily.    Marland Kitchen donepezil (Justin) 5 Figueroa tablet Take 1 tablet (5 Figueroa total) by mouth at bedtime. 30 tablet 5  . finasteride (PROSCAR) 5 Figueroa tablet Take 5 Figueroa by mouth daily.    Marland Kitchen lisinopril (PRINIVIL,ZESTRIL) 2.5 Figueroa tablet Take 1  tablet (2.5 Figueroa total) by mouth every evening. 90 tablet 0  . loperamide (IMODIUM) 2 Figueroa capsule Take by mouth as needed for diarrhea or loose stools.    . nitroGLYCERIN (NITROSTAT) 0.4 Figueroa SL tablet Place 1 tablet (0.4 Figueroa total) under the tongue every 5 (five) minutes x 3 doses as needed for chest pain. 25 tablet 2  . sertraline (ZOLOFT) 25 Figueroa tablet Take 25 Figueroa by mouth daily.    Marland Kitchen spironolactone (ALDACTONE) 25 Figueroa tablet Take 25 Figueroa by mouth daily.    . tamsulosin (FLOMAX) 0.4 Figueroa CAPS capsule Take 0.4 Figueroa by mouth daily.    . Calcium & Magnesium Carbonates (MYLANTA PO) Take 20 Figueroa by mouth daily as needed.     No facility-administered medications prior to visit.     PAST MEDICAL HISTORY: Past Medical History:  Diagnosis Date  . Acute prostatitis   . Altered mental status   . CAD (coronary artery disease)    a. 07/2015 Ant STEMI/PCI: LM nl, LAD 70/100 (3.0x28 Promus DES), LCX 70m, RCA 50p. EF 35%.  . Encephalopathy   . Epilepsy (HCC)   . Gait difficulty 01/08/2016  . HOH (hard of hearing)   . Hyperlipidemia   . Ischemic cardiomyopathy    a. 07/2015 Echo: EF 25%, diff HK, mildly dil RV w/ mildly reduced fxn, mildly dil LA.  . Memory difficulties 01/08/2016  . Neurosyphilis   . Osteoarthritis     PAST SURGICAL HISTORY: Past Surgical History:  Procedure Laterality Date  . CARDIAC CATHETERIZATION N/A 07/19/2015   Procedure: Left Heart Cath and Coronary Angiography;  Surgeon: Tonny Bollman, MD;  Location: [facility] INVASIVE CV LAB;  Service: Cardiovascular;  Laterality: N/A;    FAMILY HISTORY: Family History  Problem Relation Age of Onset  . Heart disease Mother   . Peripheral vascular disease Father     SOCIAL HISTORY: Social History   Social History  . Marital status: Married    Spouse name: N/A  . Number of children: N/A  . Years of education: N/A   Occupational History  . Not on file.   Social History Main Topics  . Smoking status: Never Smoker  . Smokeless tobacco: Never Used    . Alcohol use No  . Drug use: No  . Sexual activity: No   Other Topics Concern  . Not on file   Social History Narrative  . No narrative on file      PHYSICAL EXAM  Vitals:   02/04/17 1353  BP: 106/71  Pulse: (!) 50  Weight: 155 lb (70.3 kg)   Body mass index is 25.02 kg/m.  MMSE - Mini Mental State Exam 02/04/2017 07/09/2016 01/08/2016  Orientation to time 2 1 2   Orientation to Place 3 3 2   Registration 3 3 3   Attention/ Calculation 0 0 0  Recall 1 0 0  Language- name 2 objects 2 2 2   Language- repeat 0 0 1  Language- follow 3 step command 2 3 2   Language- read & follow direction (No  Data) 0 0  Language-read & follow direction-comments poor eyesight - -  Write a sentence (No Data) 0 0  Write a sentence-comments poor eyesight - -  Copy design (No Data) 1 0  Copy design-comments poor eyesight - -  Total score - 13 12     Generalized: Well developed, in no acute distress   Neurological examination  Mentation: Alert. Follows all commands speech and language fluent Cranial nerve II-XII: Pupils were equal round reactive to light. Extraocular movements were full, visual field were full on confrontational test. Facial sensation and strength were normal. Uvula tongue midline. Head turning and shoulder shrug  were normal and symmetric. Hard of hearing Motor: The motor testing reveals 5 over 5 strength of all 4 extremities. Good symmetric motor tone is noted throughout.  Sensory: Sensory testing is intact to soft touch on all 4 extremities. No evidence of extinction is noted.  Coordination: Cerebellar testing reveals good finger-nose-finger and heel-to-shin bilaterally.  Gait and station: Patient is in a wheelchair today.   DIAGNOSTIC DATA (LABS, IMAGING, TESTING) - I reviewed patient records, labs, notes, testing and imaging myself where available.  Lab Results  Component Value Date   WBC 10.4 10/25/2015   HGB 12.5 (L) 10/25/2015   HCT 39.4 10/25/2015   MCV 91.2  10/25/2015   PLT 240 10/25/2015      Component Value Date/Time   NA 142 10/25/2015 2022   K 3.9 10/25/2015 2022   CL 107 10/25/2015 2022   CO2 31 10/25/2015 2022   GLUCOSE 169 (H) 10/25/2015 2022   BUN 12 10/25/2015 2022   CREATININE 0.73 10/25/2015 2022   CALCIUM 8.4 (L) 10/25/2015 2022   PROT 6.4 (L) 10/25/2015 2022   ALBUMIN 2.6 (L) 10/25/2015 2022   AST 93 (H) 10/25/2015 2022   ALT 51 10/25/2015 2022   ALKPHOS 116 10/25/2015 2022   BILITOT 1.5 (H) 10/25/2015 2022   GFRNONAA >60 10/25/2015 2022   GFRAA >60 10/25/2015 2022   Lab Results  Component Value Date   CHOL 153 07/20/2015   HDL 25 (L) 07/20/2015   LDLCALC 104 (H) 07/20/2015   TRIG 118 07/20/2015   CHOLHDL 6.1 07/20/2015   Lab Results  Component Value Date   HGBA1C 6.2 (H) 07/19/2015   Lab Results  Component Value Date   VITAMINB12 736 10/08/2015   Lab Results  Component Value Date   TSH 2.995 10/09/2015      ASSESSMENT AND PLAN 83 y.o. year old male  has a past medical history of Acute prostatitis; Altered mental status; CAD (coronary artery disease); Encephalopathy; Epilepsy (HCC); Gait difficulty (01/08/2016); HOH (hard of hearing); Hyperlipidemia; Ischemic cardiomyopathy; Memory difficulties (01/08/2016); Neurosyphilis; and Osteoarthritis. here with:  1. Dementia   Unable to test the memory score today due to the patient's eyesight. The patient will remain on Justin Figueroa at bedtime. The patient's caregiver Debbora Lacrosse is suggesting of the patient follow-up with his primary care regarding his memory. I am amenable to this as long as the primary care is agreeable to managing his memory and Justin. The patient to follow-up with our office on an as-needed basis.  I spent 15 minutes with the patient 50% of this time was spent discussing his diagnosis.     Butch Penny, MSN, NP-C 02/04/2017, 2:00 PM [facility] [address] [place], [place] [address] [phone]

## 2017-02-26 NOTE — Progress Notes (Signed)
Cardiology Office Note  Date: 02/27/2017   ID: Justin Figueroa, DOB July 04, 1931, MRN [id number]  PCP: Kirstie Peri, MD  Primary Cardiologist: Nona Dell, MD   Chief Complaint  Patient presents with  . Coronary Artery Disease  . Cardiomyopathy    History of Present Illness: Justin Figueroa is an 83 y.o. male last seen in September 2017. He is here today with assistant from the [facility] where he [facility]. History is limited by dementia. He does not indicate any recent chest pain and states that overall he feels well. He reports a stable appetite.  Current cardiac regimen includes aspirin, Coreg, Lipitor, lisinopril, and Aldactone. He did have recent lab work which is outlined below. PCP is Dr. Sherryll Burger.  I personally reviewed his ECG today which shows sinus rhythm with low voltage, frequent PVCs, old inferior/anterior infarct.  Past Medical History:  Diagnosis Date  . Acute prostatitis   . Altered mental status   . CAD (coronary artery disease)    a. 07/2015 Ant STEMI/PCI: LM nl, LAD 70/100 (3.0x28 Promus DES), LCX 2m, RCA 50p. EF 35%.  . Encephalopathy   . Epilepsy (HCC)   . Gait difficulty 01/08/2016  . HOH (hard of hearing)   . Hyperlipidemia   . Ischemic cardiomyopathy    a. 07/2015 Echo: EF 25%, diff HK, mildly dil RV w/ mildly reduced fxn, mildly dil LA.  . Memory difficulties 01/08/2016  . Neurosyphilis   . Osteoarthritis     Past Surgical History:  Procedure Laterality Date  . CARDIAC CATHETERIZATION N/A 07/19/2015   Procedure: Left Heart Cath and Coronary Angiography;  Surgeon: Tonny Bollman, MD;  Location: [facility] INVASIVE CV LAB;  Service: Cardiovascular;  Laterality: N/A;    Current Outpatient Prescriptions  Medication Sig Dispense Refill  . aspirin EC 81 MG EC tablet Take 1 tablet (81 mg total) by mouth daily.    Marland Kitchen atorvastatin (LIPITOR) 80 MG tablet Take 80 mg by mouth daily.    . Calcium & Magnesium Carbonates (MYLANTA PO) Take 20 mg by mouth daily as  needed.    . carvedilol (COREG) 3.125 MG tablet Take 1 tablet (3.125 mg total) by mouth 2 (two) times daily with a meal. 60 tablet 5  . dextromethorphan-guaiFENesin (ROBITUSSIN-DM) 10-100 MG/5ML liquid Take 10 mLs by mouth every 6 (six) hours as needed for cough.    . divalproex (DEPAKOTE) 125 MG DR tablet Take 125 mg by mouth 2 (two) times daily.    Marland Kitchen donepezil (ARICEPT) 5 MG tablet Take 1 tablet (5 mg total) by mouth at bedtime. 30 tablet 5  . finasteride (PROSCAR) 5 MG tablet Take 5 mg by mouth daily.    Marland Kitchen lisinopril (PRINIVIL,ZESTRIL) 2.5 MG tablet Take 1 tablet (2.5 mg total) by mouth every evening. 90 tablet 0  . loperamide (IMODIUM) 2 MG capsule Take by mouth as needed for diarrhea or loose stools.    . nitroGLYCERIN (NITROSTAT) 0.4 MG SL tablet Place 1 tablet (0.4 mg total) under the tongue every 5 (five) minutes x 3 doses as needed for chest pain. 25 tablet 2  . sertraline (ZOLOFT) 25 MG tablet Take 25 mg by mouth daily.    Marland Kitchen spironolactone (ALDACTONE) 25 MG tablet Take 25 mg by mouth daily.    . tamsulosin (FLOMAX) 0.4 MG CAPS capsule Take 0.4 mg by mouth daily.     No current facility-administered medications for this visit.    Allergies:  Patient has no known allergies.   Social History: The  patient  reports that he has never smoked. He has never used smokeless tobacco. He reports that he does not drink alcohol or use drugs.   ROS:  Please see the history of present illness. Otherwise, complete review of systems is positive for dementia and hearing loss.  All other systems are reviewed and negative.   Physical Exam: VS:  BP 92/61   Pulse 77   Ht 6' (1.829 m)   Wt 156 lb (70.8 kg)   SpO2 93%   BMI 21.16 kg/m , BMI Body mass index is 21.16 kg/m.  Wt Readings from Last 3 Encounters:  02/27/17 156 lb (70.8 kg)  02/04/17 155 lb (70.3 kg)  08/19/16 162 lb (73.5 kg)    General: Elderly male seated in wheelchair, appears comfortable at rest. HEENT: Conjunctiva and lids  normal, oropharynx clear with poor dentition. Neck: Supple, no elevated JVP or carotid bruits, no thyromegaly. Lungs: Clear to auscultation, nonlabored breathing at rest. Cardiac: Regular rate and rhythm, no S3 or significant systolic murmur, no pericardial rub. Abdomen: Soft, nontender, bowel sounds present. Extremities: No pitting edema, wrap on left knee, distal pulses 2+. Skin: Warm and dry. Musculoskeletal: Mild kyphosis. Neuropsychiatric: Alert and oriented x1, affect grossly appropriate. Decreased hearing.  ECG: I personally reviewed the tracing from 02/06/2016 which showed sinus rhythm with PVCs, old anterior infarct pattern, R' in lead V1 and V2, rule out old inferior infarct pattern versus left anterior fascicular block.  Recent Labwork:  March 2018: Cholesterol 83, triglycerides 78, HDL 28, LDL 39, AST 16, ALT 10, hemoglobin 13, platelets 313, potassium 4.6, BUN 12, creatinine 0.66  Other Studies Reviewed Today:  Echocardiogram 07/20/2015: Study Conclusions  - Left ventricle: The cavity size was normal. Wall thickness was  normal. The estimated ejection fraction was 25%. Diffuse  hypokinesis. - Right ventricle: The cavity size was mildly dilated. Systolic  function was mildly reduced. - Right atrium: The atrium was mildly dilated.  Assessment and Plan:  1. Symptomatically stable CAD status post anterior STEMI in August 2016 and placement of DES to the LAD. Plan to continue medical therapy and overall conservative management. ECG reviewed.  2. Secondary cardiomyopathy with LVEF approximately 25%. No evidence of volume overload. I reviewed his recent lab work and plan to continue current medications. He is not a candidate for ICD.  3. Hyperlipidemia, on Lipitor. Recent LDL 39.  4. Significant dementia. He is on Aricept. PCP Dr. Sherryll BurgerShah.  Current medicines were reviewed with the patient today.   Orders Placed This Encounter  Procedures  . EKG 12-Lead     Disposition: Follow-up in 6 months.  Signed, Jonelle SidleSamuel G. Duey Liller, MD, [facility] 02/27/2017 9:42 AM    [facility] [address], [place], [place] [address] Phone: [phone]; Fax: [phone]

## 2017-02-27 ENCOUNTER — Ambulatory Visit (INDEPENDENT_AMBULATORY_CARE_PROVIDER_SITE_OTHER): Payer: Medicare Other | Admitting: Cardiology

## 2017-02-27 ENCOUNTER — Encounter: Payer: Self-pay | Admitting: Cardiology

## 2017-02-27 VITALS — BP 92/61 | HR 77 | Ht 72.0 in | Wt 156.0 lb

## 2017-02-27 DIAGNOSIS — I255 Ischemic cardiomyopathy: Secondary | ICD-10-CM

## 2017-02-27 DIAGNOSIS — E782 Mixed hyperlipidemia: Secondary | ICD-10-CM | POA: Diagnosis not present

## 2017-02-27 DIAGNOSIS — F0391 Unspecified dementia with behavioral disturbance: Secondary | ICD-10-CM

## 2017-02-27 DIAGNOSIS — I2510 Atherosclerotic heart disease of native coronary artery without angina pectoris: Secondary | ICD-10-CM | POA: Diagnosis not present

## 2017-02-27 NOTE — Patient Instructions (Signed)

## 2017-05-08 IMAGING — CR DG CHEST 2V
2 series · 2 of 2 positions shown · non-contrast
Comparison: October 03, 2015

CLINICAL DATA: Two week history of cough

EXAM:
CHEST  2 VIEW

[chest lat]
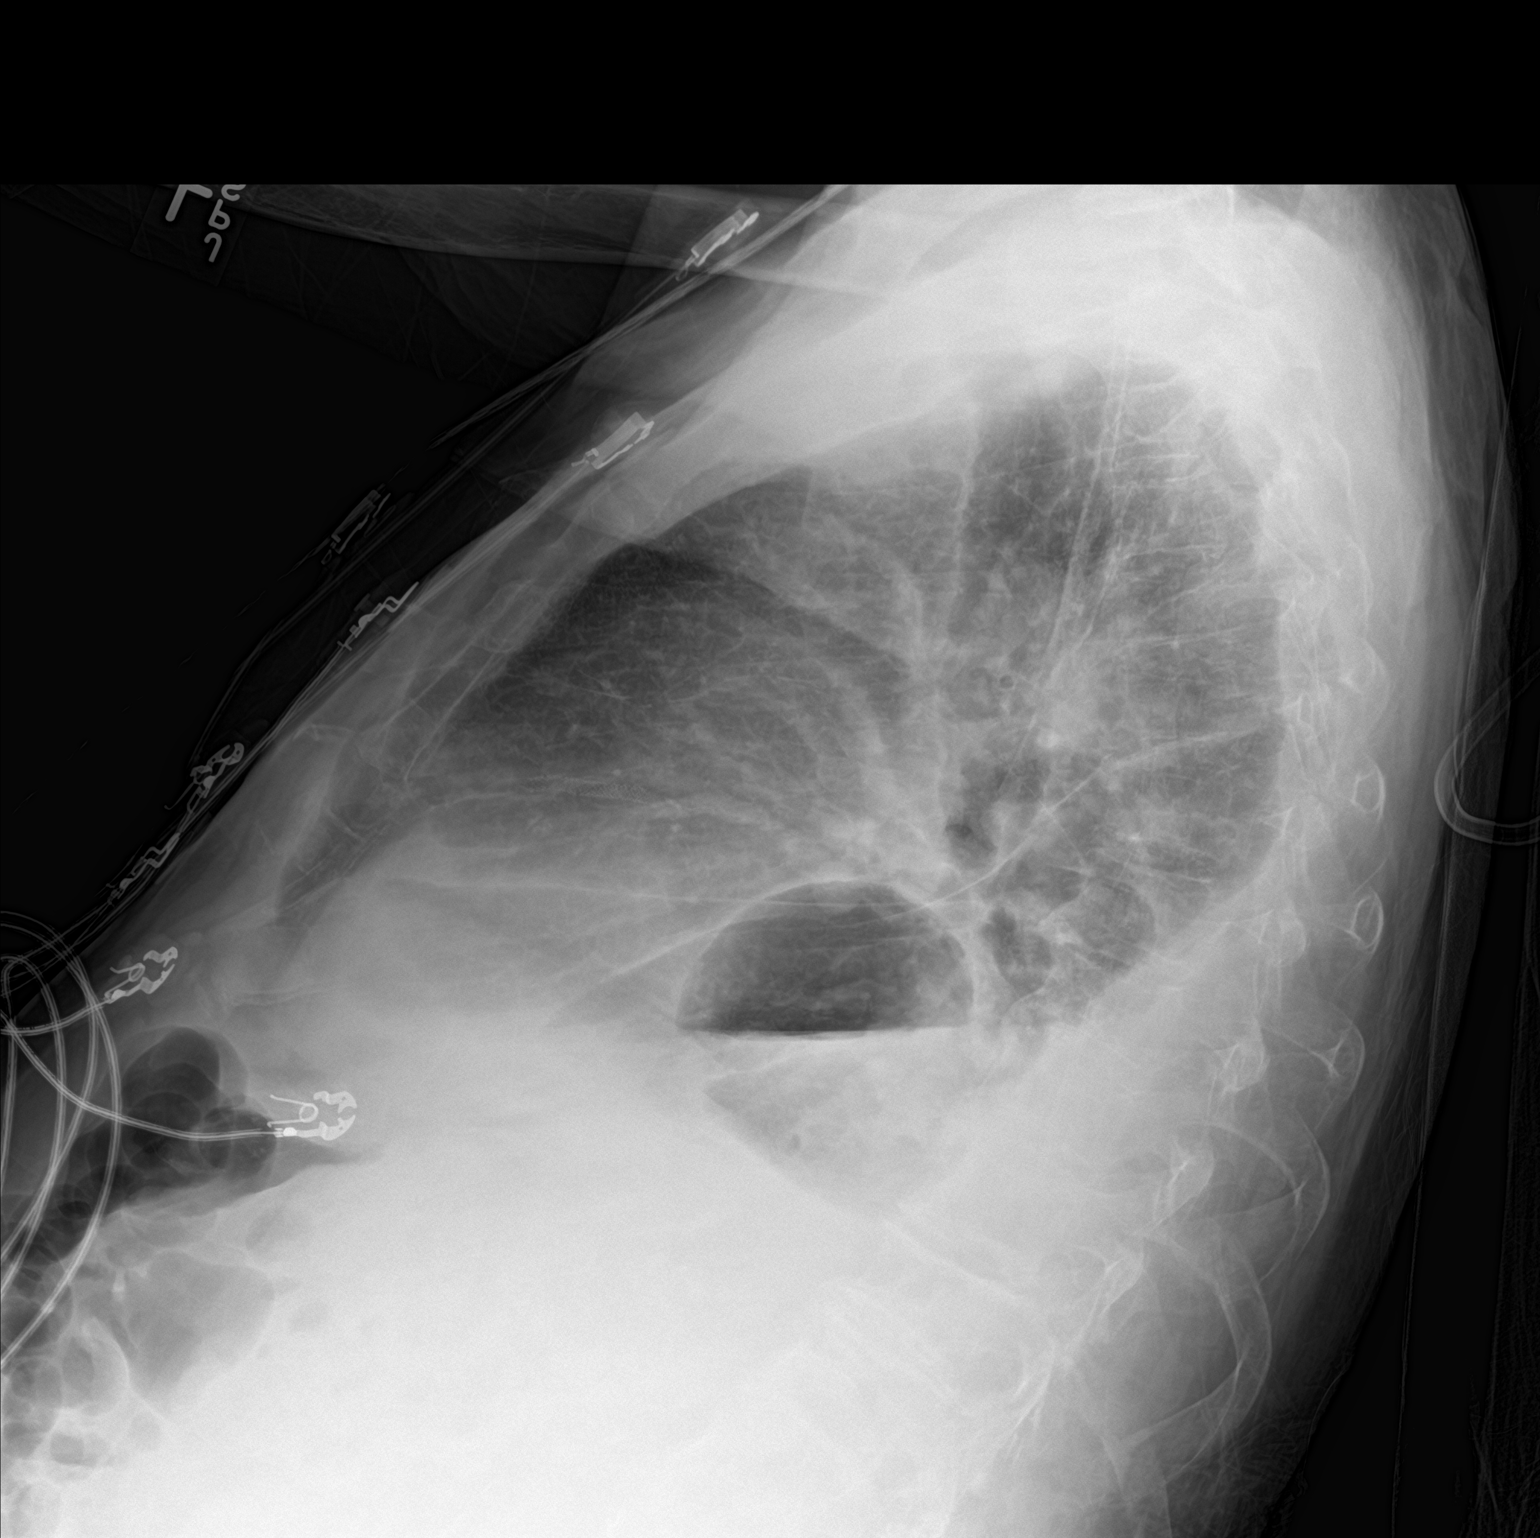

[chest ap]
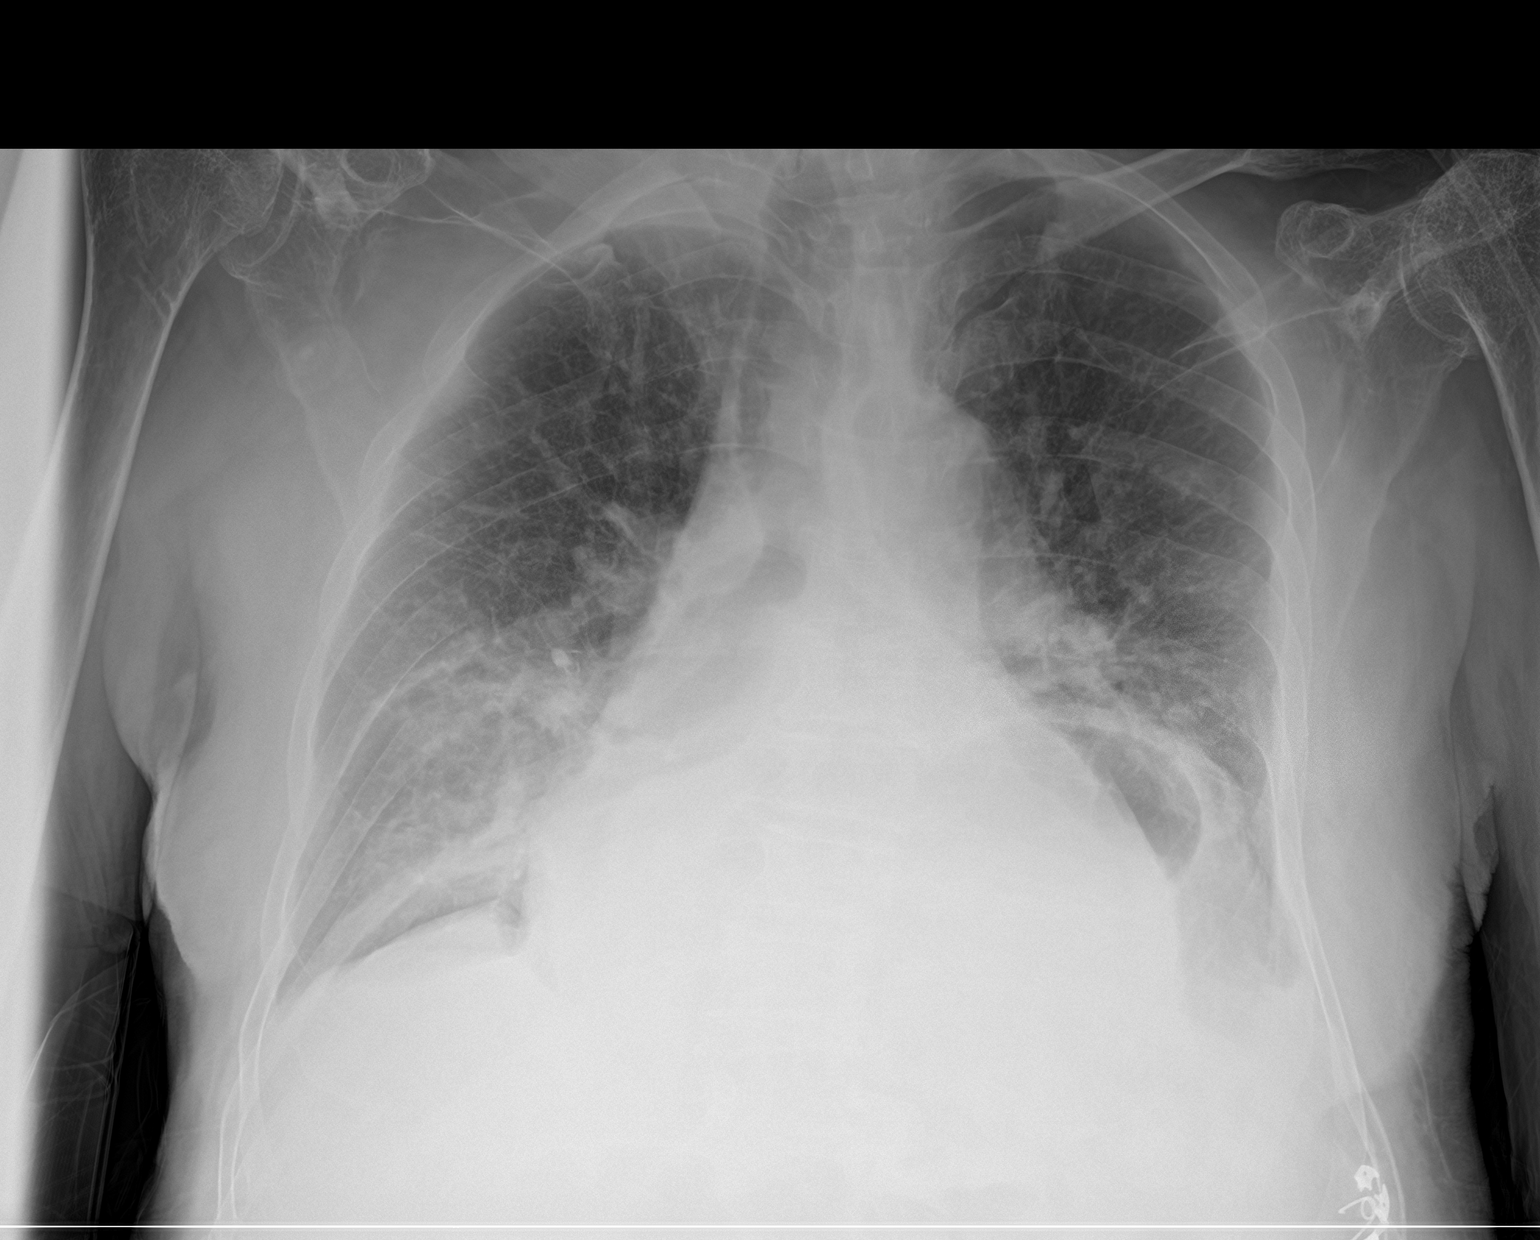

[2 of 2 positions shown; findings below may reference images not displayed]

FINDINGS: There is persistent airspace consolidation in the right lower lobe,
slightly less compared to study from 5 days previously. There are
persistent rather minimal pleural effusions bilaterally. There is
atelectatic change in the left base which is stable. Heart is
enlarged with pulmonary vascular within normal limits. There is a
sizable paraesophageal type hernia. No adenopathy. Bones are
somewhat osteoporotic. There is superior migration of both humeral
heads consistent with chronic rotator cuff tears.
IMPRESSION: Persistent right lower lobe airspace consolidation with pole
minimally less opacity compared to 5 days previously. Atelectatic
change left base. Minimal pleural effusions bilaterally. Stable
cardiac prominence. Sizable paraesophageal hernia, stable. Evidence
of chronic rotator cuff tears bilaterally.

## 2017-05-25 IMAGING — CT CT HEAD W/O CM
1 series · 16 of 30 positions shown, 20 images · non-contrast
Comparison: Prior MRI from 10/09/2015.

CLINICAL DATA: Initial evaluation for acute facial droop.

EXAM:
CT HEAD WITHOUT CONTRAST
TECHNIQUE: Contiguous axial images were obtained from the base of the skull
through the vertex without intravenous contrast.

[Series 2: headseq 4.8 h37s · axial · 0.43mm/px · z∈[+101,+236]mm · 16 of 30 slices shown, 20 images]
[im 2/30  brain]
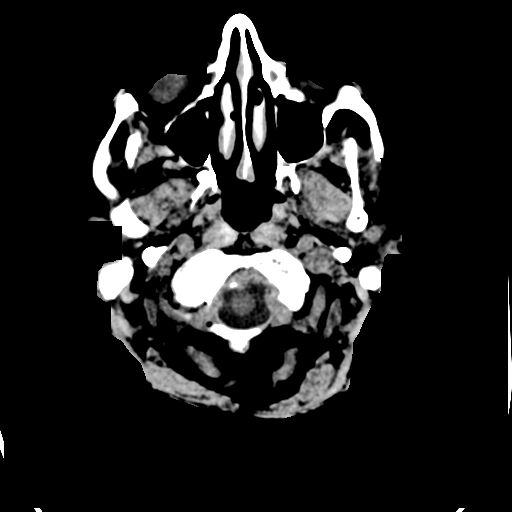
[im 2/30  bone]
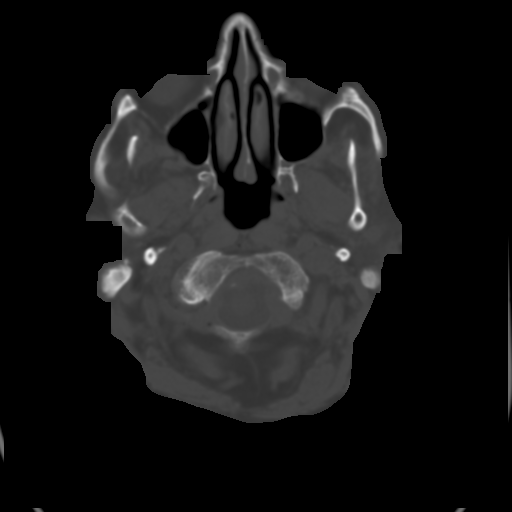
[im 4/30  brain]
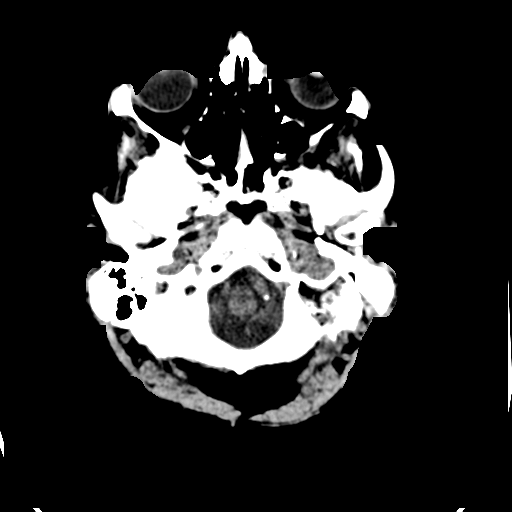
[im 6/30  brain]
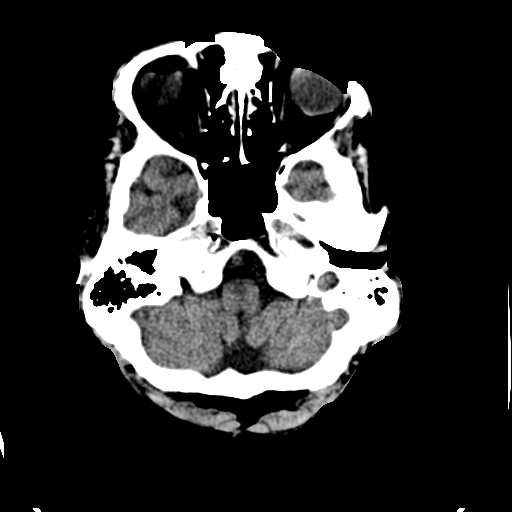
[im 8/30  brain]
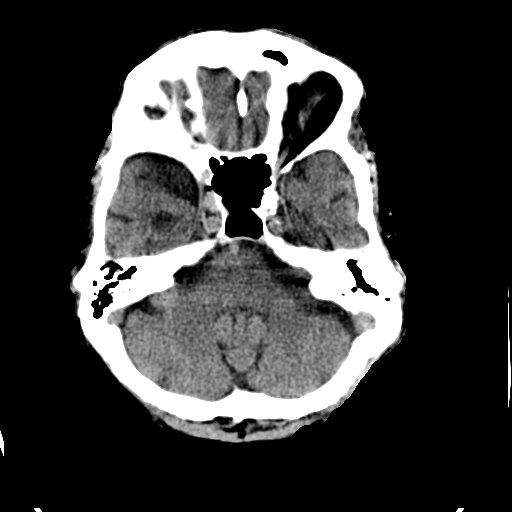
[im 9/30  brain]
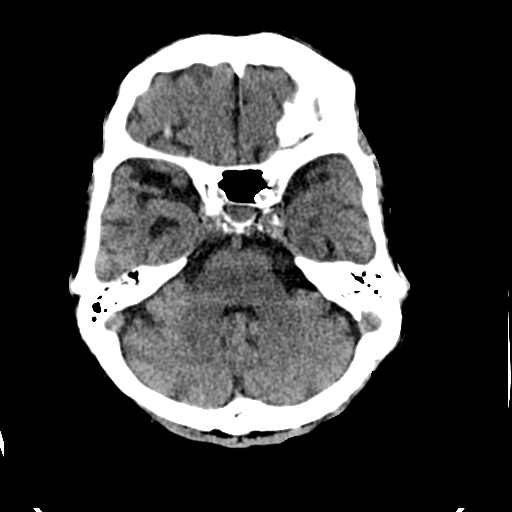
[im 9/30  bone]
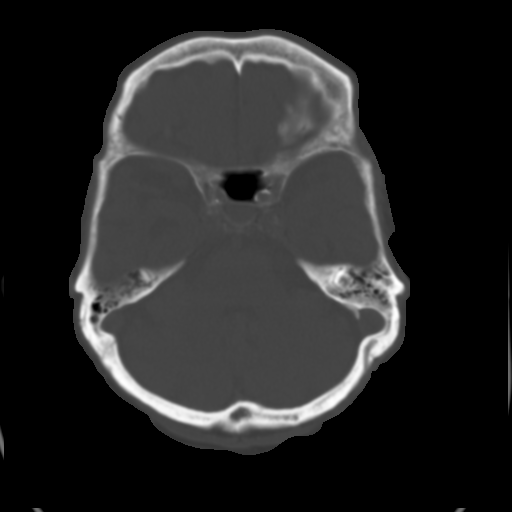
[im 11/30  brain]
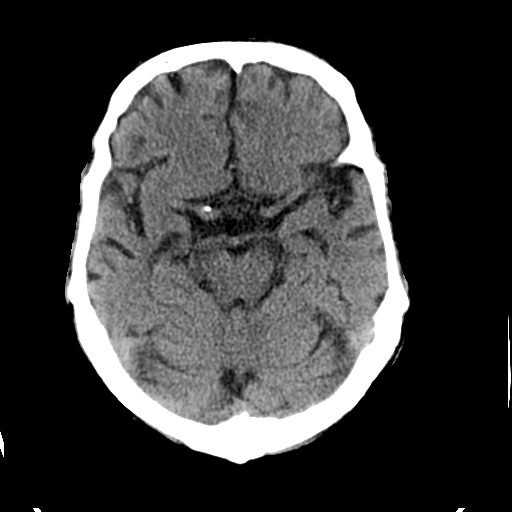
[im 13/30  brain]
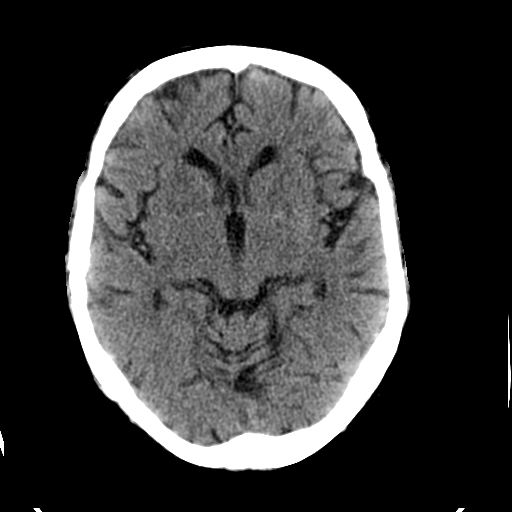
[im 15/30  brain]
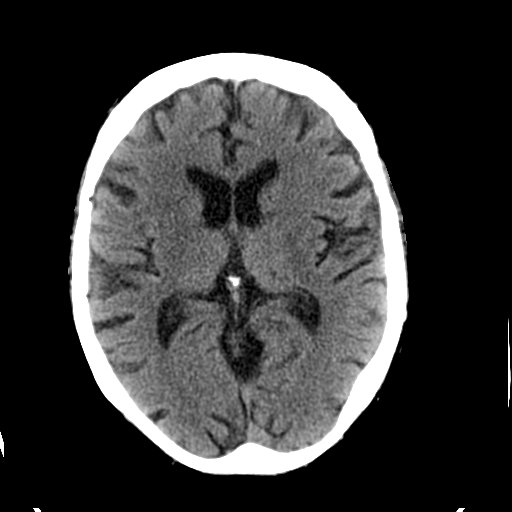
[im 16/30  brain]
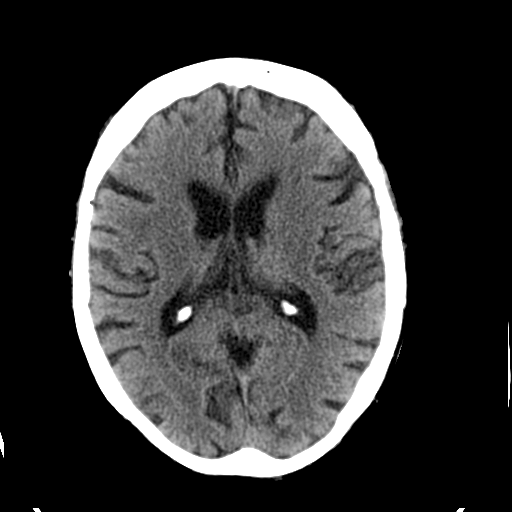
[im 16/30  bone]
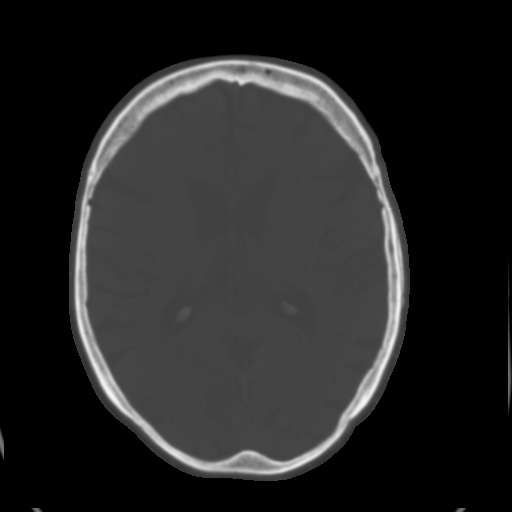
[im 18/30  brain]
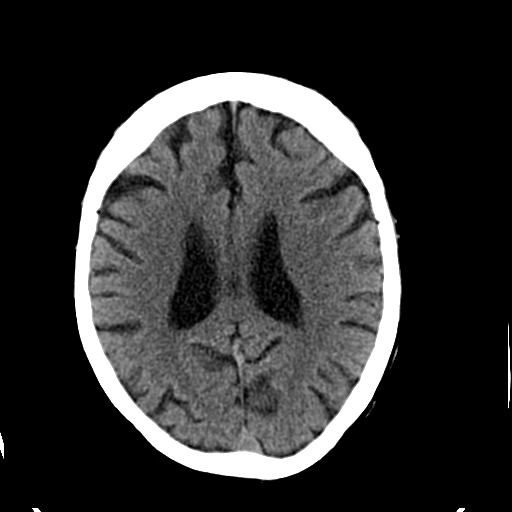
[im 20/30  brain]
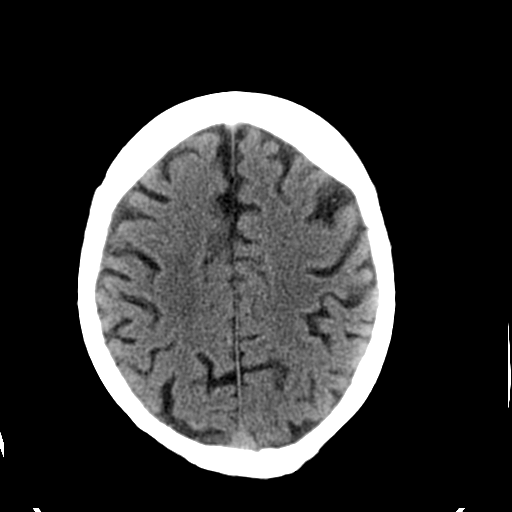
[im 22/30  brain]
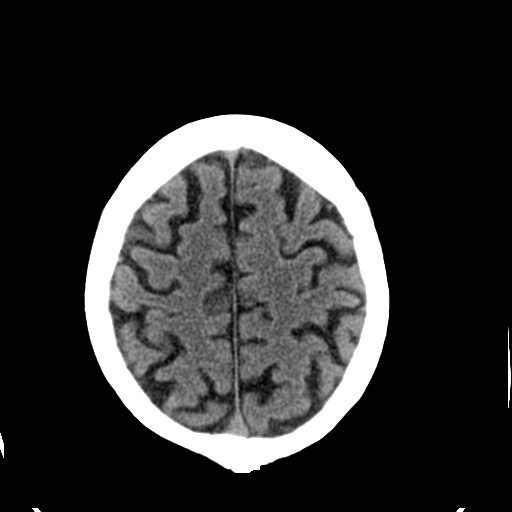
[im 23/30  brain]
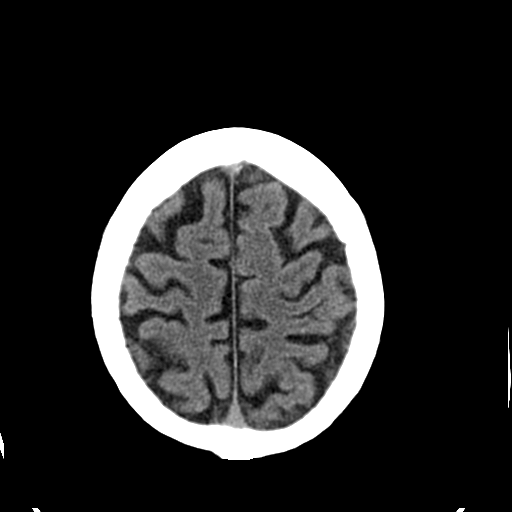
[im 23/30  bone]
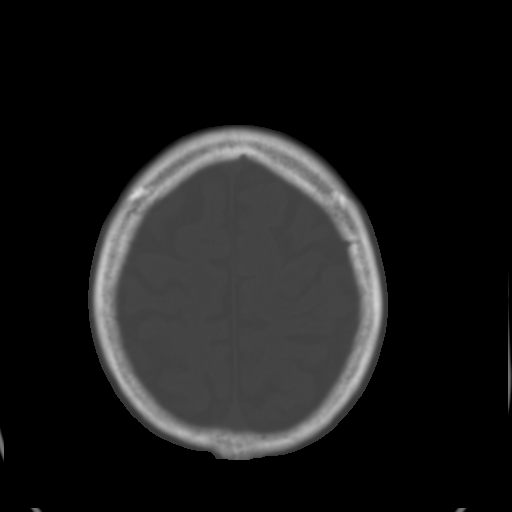
[im 25/30  brain]
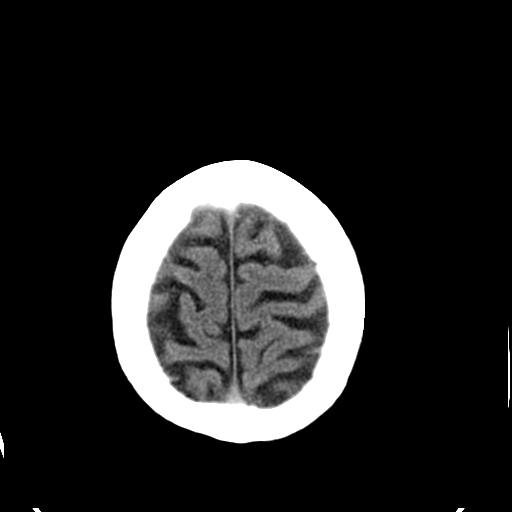
[im 27/30  brain]
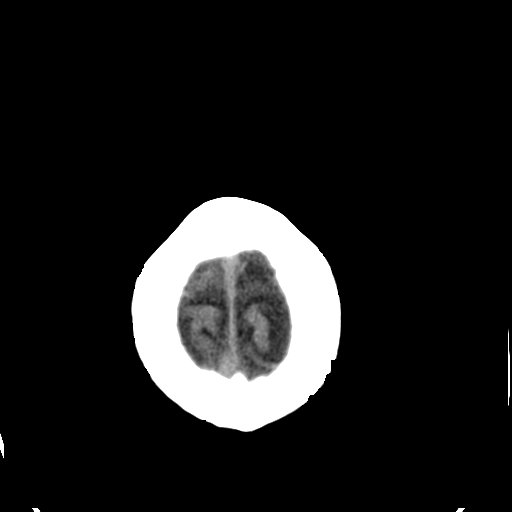
[im 29/30  brain]
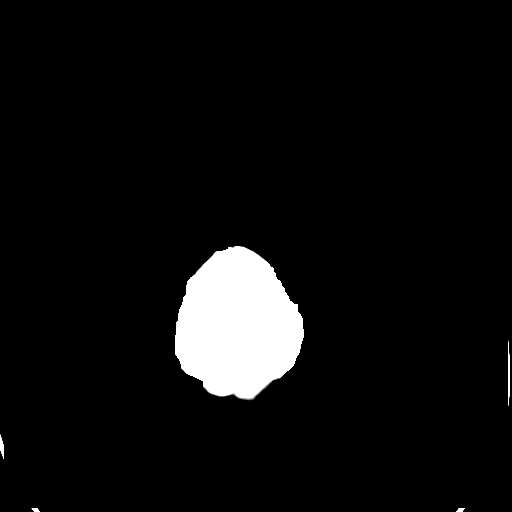

[16 of 30 positions shown; findings below may reference images not displayed]

FINDINGS: Generalized cerebral atrophy with chronic microvascular ischemic
disease present. Scattered vascular calcifications within the
carotid siphons. Remote lacunar infarct within the left thalamus. No
acute large vessel territory infarct. No intracranial hemorrhage. No
mass lesion, midline shift, or mass effect. No hydrocephalus. No
extra-axial fluid collection.

Scalp soft tissues within normal limits. No acute abnormality about
the orbits.

Few scattered foci of soft tissue emphysema noted, like related to
IV access. Paranasal sinuses are clear. Left mastoid effusion noted.

Calvarium intact.
IMPRESSION: 1. No acute intracranial process.
2. Age-related cerebral atrophy with chronic small vessel ischemic
disease.
3. Left mastoid effusion.

## 2017-05-25 IMAGING — CR DG CHEST 1V PORT
1 series · 1 of 1 positions shown · non-contrast
Comparison: October 08, 2015

CLINICAL DATA: Chest pain and nausea.

EXAM:
PORTABLE CHEST 1 VIEW

[ap portable]
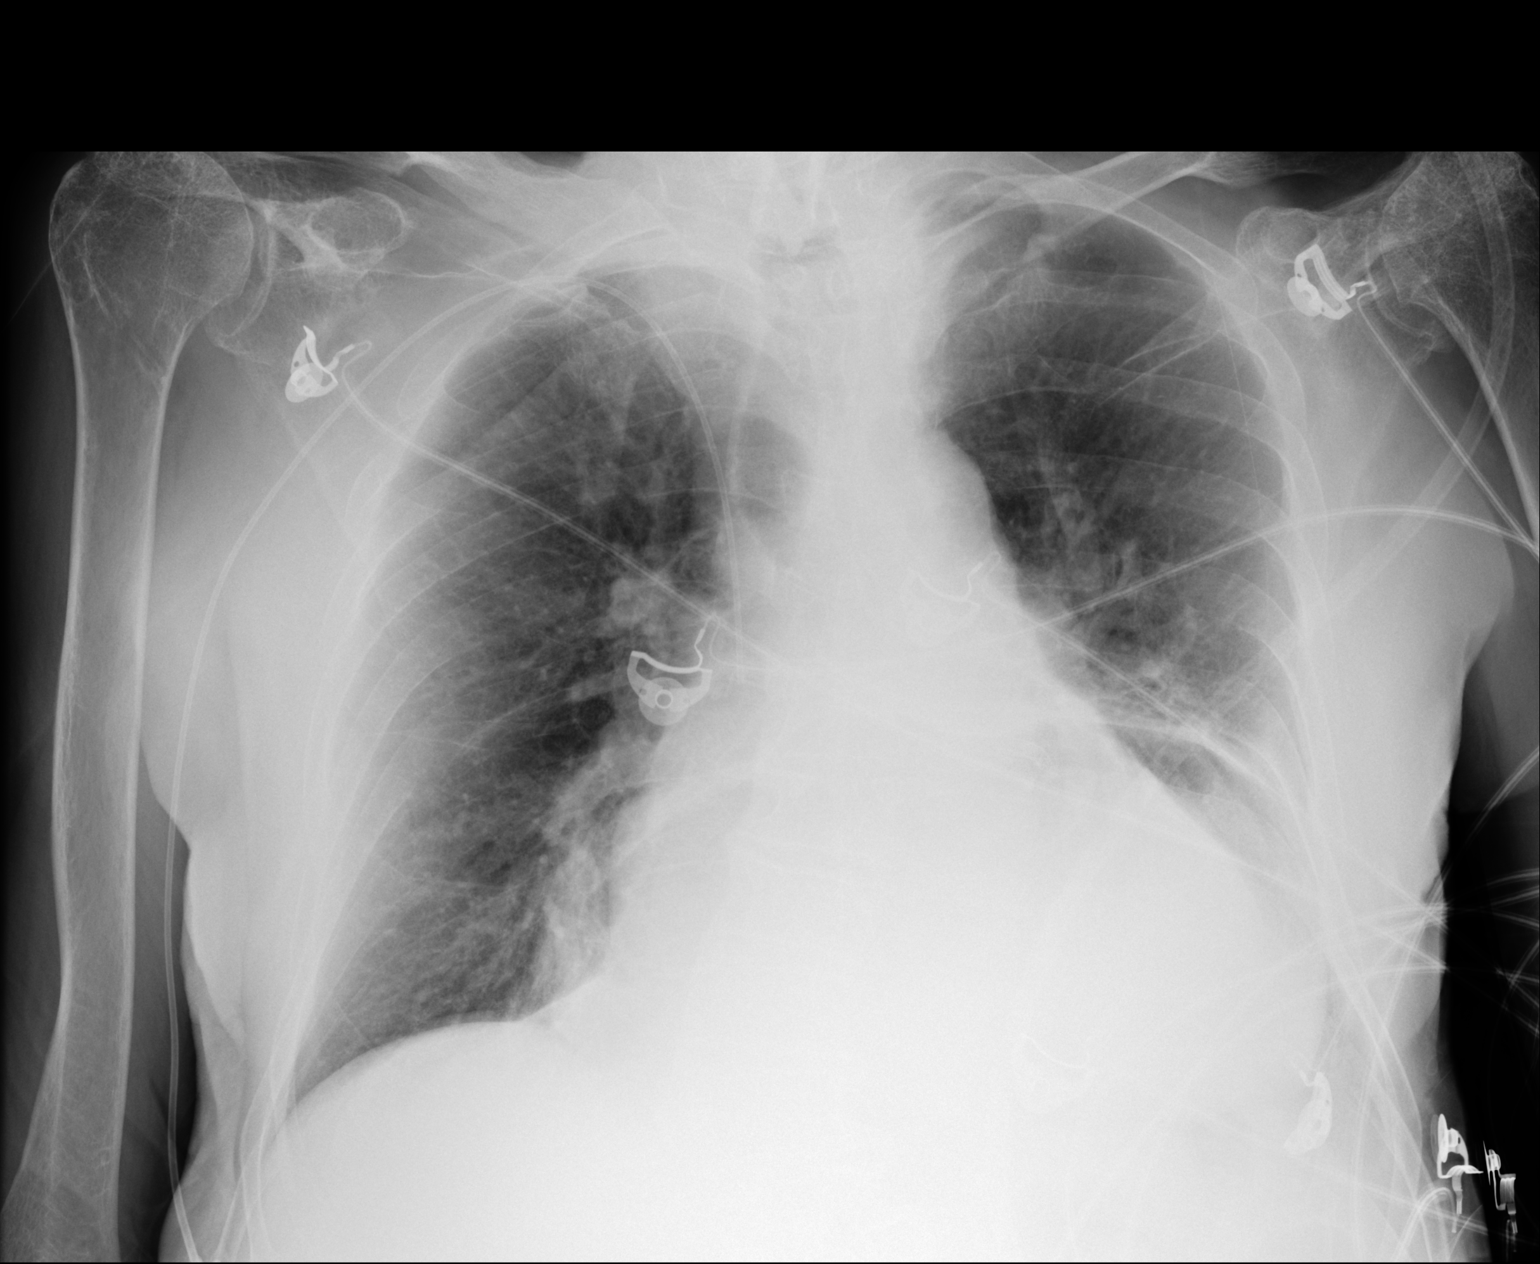

[1 of 1 positions shown; findings below may reference images not displayed]

FINDINGS: Central catheter tip is in the superior vena cava. No pneumothorax.
There is a sizable paraesophageal type hernia. There is left base
atelectasis. There is patchy consolidation in the medial right base,
less than on recent prior study. Lungs elsewhere clear. Heart is
enlarged with pulmonary vascularity within normal limits. No
adenopathy. There is degenerative change in both shoulders with
superior migration of both humeral heads.
IMPRESSION: Patchy consolidation medial right base, less than on recent prior
study. Persistent atelectatic change left base. No new opacity
appreciable. There is a paraesophageal type hernia. There is stable
cardiomegaly. Chronic rotator cuff tears present bilaterally.
Central catheter tip in superior vena cava. No pneumothorax.

## 2018-09-10 NOTE — Progress Notes (Signed)
Cardiology Office Note  Date: 09/13/2018   ID: GAR GLANCE, DOB [birth year], MRN [id number]  PCP: Kirstie Peri, MD  Primary Cardiologist: Nona Dell, MD   Chief Complaint  Patient presents with  . Coronary Artery Disease    History of Present Illness: Justin Figueroa is an 84 y.o. male last seen in March 2018.  He presents today overdue for follow-up, still resides at the [facility], here with an Geophysicist/field seismologist.  He is in a wheelchair and his history is limited by dementia.  I asked him about chest pain or breathlessness and he denied having any symptoms recently.  I went over his medications which are listed below.  Cardiac regimen includes aspirin, Lipitor, Aldactone, and as needed nitroglycerin.  He has been taken off Coreg and ACE inhibitor in the interim, presumably related to low blood pressures.  I personally reviewed his ECG today which shows sinus rhythm with low voltage and nonspecific ST-T changes.  Past Medical History:  Diagnosis Date  . Acute prostatitis   . Altered mental status   . CAD (coronary artery disease)    a. 07/2015 Ant STEMI/PCI: LM nl, LAD 70/100 (3.0x28 Promus DES), LCX 52m, RCA 50p. EF 35%.  . Encephalopathy   . Epilepsy (HCC)   . Gait difficulty 01/08/2016  . HOH (hard of hearing)   . Hyperlipidemia   . Ischemic cardiomyopathy    a. 07/2015 Echo: EF 25%, diff HK, mildly dil RV w/ mildly reduced fxn, mildly dil LA.  . Memory difficulties 01/08/2016  . Neurosyphilis   . Osteoarthritis     Past Surgical History:  Procedure Laterality Date  . CARDIAC CATHETERIZATION N/A 07/19/2015   Procedure: Left Heart Cath and Coronary Angiography;  Surgeon: Tonny Bollman, MD;  Location: [facility] INVASIVE CV LAB;  Service: Cardiovascular;  Laterality: N/A;    Current Outpatient Medications  Medication Sig Dispense Refill  . aspirin EC 81 MG EC tablet Take 1 tablet (81 mg total) by mouth daily.    Marland Kitchen atorvastatin (LIPITOR) 80 MG tablet Take 80 mg by mouth  daily.    . Calcium & Magnesium Carbonates (MYLANTA PO) Take 20 mg by mouth daily as needed.    Marland Kitchen dextromethorphan-guaiFENesin (ROBITUSSIN-DM) 10-100 MG/5ML liquid Take 10 mLs by mouth every 6 (six) hours as needed for cough.    . divalproex (DEPAKOTE) 125 MG DR tablet Take 125 mg by mouth 2 (two) times daily.    . famotidine (PEPCID) 10 MG tablet Take 10 mg by mouth 2 (two) times daily.    . finasteride (PROSCAR) 5 MG tablet Take 5 mg by mouth daily.    . nitroGLYCERIN (NITROSTAT) 0.4 MG SL tablet Place 1 tablet (0.4 mg total) under the tongue every 5 (five) minutes x 3 doses as needed for chest pain. 25 tablet 2  . ranitidine (ZANTAC) 75 MG tablet Take 75 mg by mouth 2 (two) times daily.    . sertraline (ZOLOFT) 25 MG tablet Take 25 mg by mouth daily.    Marland Kitchen spironolactone (ALDACTONE) 25 MG tablet Take 25 mg by mouth daily.    . tamsulosin (FLOMAX) 0.4 MG CAPS capsule Take 0.4 mg by mouth daily.     No current facility-administered medications for this visit.    Allergies:  Patient has no known allergies.   Social History: The patient  reports that he has never smoked. He has never used smokeless tobacco. He reports that he does not drink alcohol or use drugs.   ROS:  Please see the history of present illness. Otherwise, complete review of systems is positive for hearing loss.  All other systems are reviewed and negative.   Physical Exam: VS:  BP (!) 92/52   Pulse 70   Ht 6' (1.829 m)   Wt 166 lb (75.3 kg)   SpO2 92%   BMI 22.51 kg/m , BMI Body mass index is 22.51 kg/m.  Wt Readings from Last 3 Encounters:  09/13/18 166 lb (75.3 kg)  02/27/17 156 lb (70.8 kg)  02/04/17 155 lb (70.3 kg)    General: Elderly male in wheelchair, no distress. HEENT: Conjunctiva and lids normal, oropharynx clear with poor dentition. Neck: Supple, no elevated JVP or carotid bruits, no thyromegaly. Lungs: Clear to auscultation, nonlabored breathing at rest. Cardiac: Regular rate and rhythm, no S3 or  significant systolic murmur. Abdomen: Soft, nontender, bowel sounds present. Extremities: No pitting edema, distal pulses 2+. Skin: Warm and dry. Musculoskeletal: Mild kyphosis. Neuropsychiatric: Alert and oriented x1, hearing loss evident.  ECG: I personally reviewed the tracing from 02/27/2017 which showed sinus rhythm with low voltage, frequent PVCs, old inferior/anterior infarct pattern.  Recent Labwork:  March 2018: Cholesterol 83, triglycerides 78, HDL 28, LDL 39, AST 16, ALT 10, hemoglobin 13, platelets 313, potassium 4.6, BUN 12, creatinine 0.66  Other Studies Reviewed Today:  Echocardiogram 07/20/2015: Study Conclusions  - Left ventricle: The cavity size was normal. Wall thickness was  normal. The estimated ejection fraction was 25%. Diffuse  hypokinesis. - Right ventricle: The cavity size was mildly dilated. Systolic  function was mildly reduced. - Right atrium: The atrium was mildly dilated.  Assessment and Plan:  1.  CAD with history of previous anterior STEMI in 2016 and DES intervention to the LAD.  We are following him conservatively on medical therapy.  No obvious, progressive angina symptoms.  ECG reviewed and stable.  Continue aspirin and statin.  2.  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  He has been taken off Coreg and ACE inhibitor in the interim, presumably related to low blood pressures at baseline.  No evidence of fluid overload on examination.  Plan is to follow this conservatively.  He is not a candidate for ICD.  3.  Mixed hyperlipidemia, continues on Lipitor with follow-up per PCP.  Current medicines were reviewed with the patient today.   Orders Placed This Encounter  Procedures  . EKG 12-Lead    Disposition: Follow-up in 1 year.  Signed, Jonelle Sidle, MD, [facility] 09/13/2018 11:43 AM    [facility] at [facility] [address] [place], [place], [place] [address] Phone: [phone]; Fax: [phone]

## 2018-09-13 ENCOUNTER — Ambulatory Visit (INDEPENDENT_AMBULATORY_CARE_PROVIDER_SITE_OTHER): Payer: Medicare Other | Admitting: Cardiology

## 2018-09-13 ENCOUNTER — Encounter: Payer: Self-pay | Admitting: Cardiology

## 2018-09-13 VITALS — BP 92/52 | HR 70 | Ht 72.0 in | Wt 166.0 lb

## 2018-09-13 DIAGNOSIS — I255 Ischemic cardiomyopathy: Secondary | ICD-10-CM

## 2018-09-13 DIAGNOSIS — E782 Mixed hyperlipidemia: Secondary | ICD-10-CM

## 2018-09-13 DIAGNOSIS — I2510 Atherosclerotic heart disease of native coronary artery without angina pectoris: Secondary | ICD-10-CM

## 2018-09-13 NOTE — Patient Instructions (Addendum)

## 2019-09-01 DEATH — deceased

## 2019-11-21 ENCOUNTER — Telehealth: Payer: Self-pay | Admitting: Cardiology

## 2019-11-21 NOTE — Telephone Encounter (Signed)
Left message for Tiara/Kiara asking for help with obtaining information about patient passing.  Explained we need to be able to mark chart deceased.
